# Patient Record
Sex: Female | Born: 1965 | Race: White | Hispanic: No | Marital: Married | Smoking: Never smoker
Health system: Southern US, Community
[De-identification: ages and names within clinical notes are randomized; demographics above are authoritative.]

## PROBLEM LIST (undated history)

## (undated) DIAGNOSIS — F32A Depression, unspecified: Secondary | ICD-10-CM

## (undated) DIAGNOSIS — K219 Gastro-esophageal reflux disease without esophagitis: Secondary | ICD-10-CM

## (undated) DIAGNOSIS — C4490 Unspecified malignant neoplasm of skin, unspecified: Secondary | ICD-10-CM

## (undated) DIAGNOSIS — F1210 Cannabis abuse, uncomplicated: Secondary | ICD-10-CM

## (undated) DIAGNOSIS — F419 Anxiety disorder, unspecified: Secondary | ICD-10-CM

## (undated) DIAGNOSIS — J449 Chronic obstructive pulmonary disease, unspecified: Secondary | ICD-10-CM

## (undated) DIAGNOSIS — F329 Major depressive disorder, single episode, unspecified: Secondary | ICD-10-CM

## (undated) DIAGNOSIS — B1910 Unspecified viral hepatitis B without hepatic coma: Secondary | ICD-10-CM

## (undated) DIAGNOSIS — S52501A Unspecified fracture of the lower end of right radius, initial encounter for closed fracture: Secondary | ICD-10-CM

## (undated) HISTORY — PX: COLONOSCOPY WITH PROPOFOL: SHX5780

---

## 2013-08-06 ENCOUNTER — Ambulatory Visit: Payer: Self-pay | Admitting: Gastroenterology

## 2013-08-09 LAB — PATHOLOGY REPORT

## 2015-12-04 ENCOUNTER — Other Ambulatory Visit: Payer: Self-pay | Admitting: Obstetrics and Gynecology

## 2015-12-04 DIAGNOSIS — Z1231 Encounter for screening mammogram for malignant neoplasm of breast: Secondary | ICD-10-CM

## 2016-01-05 ENCOUNTER — Emergency Department
Admission: EM | Admit: 2016-01-05 | Discharge: 2016-01-05 | Disposition: A | Discharge status: Home / Self Care | Payer: Self-pay | Attending: Emergency Medicine | Admitting: Emergency Medicine

## 2016-01-05 DIAGNOSIS — Z85828 Personal history of other malignant neoplasm of skin: Secondary | ICD-10-CM | POA: Insufficient documentation

## 2016-01-05 DIAGNOSIS — R197 Diarrhea, unspecified: Secondary | ICD-10-CM | POA: Insufficient documentation

## 2016-01-05 DIAGNOSIS — Z79899 Other long term (current) drug therapy: Secondary | ICD-10-CM | POA: Insufficient documentation

## 2016-01-05 DIAGNOSIS — Z7982 Long term (current) use of aspirin: Secondary | ICD-10-CM | POA: Insufficient documentation

## 2016-01-05 DIAGNOSIS — R112 Nausea with vomiting, unspecified: Secondary | ICD-10-CM | POA: Insufficient documentation

## 2016-01-05 DIAGNOSIS — R109 Unspecified abdominal pain: Secondary | ICD-10-CM | POA: Insufficient documentation

## 2016-01-05 HISTORY — DX: Unspecified malignant neoplasm of skin, unspecified: C44.90

## 2016-01-05 HISTORY — DX: Unspecified viral hepatitis B without hepatic coma: B19.10

## 2016-01-05 LAB — URINALYSIS, COMPLETE (UACMP) WITH MICROSCOPIC
Bilirubin Urine: NEGATIVE
Glucose, UA: NEGATIVE mg/dL
Ketones, ur: 5 mg/dL — AB
Leukocytes, UA: NEGATIVE
Nitrite: NEGATIVE
PH: 6 (ref 5.0–8.0)
Protein, ur: 30 mg/dL — AB
SPECIFIC GRAVITY, URINE: 1.026 (ref 1.005–1.030)

## 2016-01-05 LAB — COMPREHENSIVE METABOLIC PANEL
ALK PHOS: 57 U/L (ref 38–126)
ALT: 20 U/L (ref 14–54)
AST: 21 U/L (ref 15–41)
Albumin: 4 g/dL (ref 3.5–5.0)
Anion gap: 10 (ref 5–15)
BUN: 13 mg/dL (ref 6–20)
CALCIUM: 9 mg/dL (ref 8.9–10.3)
CO2: 23 mmol/L (ref 22–32)
CREATININE: 0.75 mg/dL (ref 0.44–1.00)
Chloride: 104 mmol/L (ref 101–111)
GFR calc non Af Amer: >60 mL/min (ref >60)
Glucose, Bld: 112 mg/dL — ABNORMAL HIGH (ref 65–99)
Potassium: 3 mmol/L — ABNORMAL LOW (ref 3.5–5.1)
SODIUM: 137 mmol/L (ref 135–145)
Total Bilirubin: 1 mg/dL (ref 0.3–1.2)
Total Protein: 7 g/dL (ref 6.5–8.1)

## 2016-01-05 LAB — CBC
HCT: 42.1 % (ref 35.0–47.0)
Hemoglobin: 14.9 g/dL (ref 12.0–16.0)
MCH: 32.4 pg (ref 26.0–34.0)
MCHC: 35.3 g/dL (ref 32.0–36.0)
MCV: 91.7 fL (ref 80.0–100.0)
PLATELETS: 221 10*3/uL (ref 150–440)
RBC: 4.6 MIL/uL (ref 3.80–5.20)
RDW: 13.5 % (ref 11.5–14.5)
WBC: 7.3 10*3/uL (ref 3.6–11.0)

## 2016-01-05 LAB — LIPASE, BLOOD: Lipase: 19 U/L (ref 11–51)

## 2016-01-05 MED ORDER — SODIUM CHLORIDE 0.9 % IV BOLUS (SEPSIS)
1000.0000 mL | Freq: Once | INTRAVENOUS | Status: AC
  Administered 2016-01-05: 1000 mL via INTRAVENOUS

## 2016-01-05 MED ORDER — PROMETHAZINE HCL 25 MG/ML IJ SOLN
6.2500 mg | Freq: Once | INTRAMUSCULAR | Status: AC
  Administered 2016-01-05: 6.25 mg via INTRAVENOUS
  Filled 2016-01-05: qty 1

## 2016-01-05 MED ORDER — POTASSIUM CHLORIDE CRYS ER 20 MEQ PO TBCR
20.0000 meq | EXTENDED_RELEASE_TABLET | Freq: Once | ORAL | Status: AC
  Administered 2016-01-05: 20 meq via ORAL
  Filled 2016-01-05: qty 1

## 2016-01-05 MED ORDER — PROMETHAZINE HCL 12.5 MG RE SUPP: 12.5000 mg | Freq: Four times a day (QID) | RECTAL | 0 refills | Status: DC | PRN

## 2016-01-05 MED ORDER — PROMETHAZINE HCL 12.5 MG PO TABS: 12.5000 mg | ORAL_TABLET | Freq: Four times a day (QID) | ORAL | 0 refills | Status: DC | PRN

## 2016-01-05 NOTE — ED Notes (Signed)
Dr Marcelene Butte notified of bradycardia

## 2016-01-05 NOTE — Discharge Instructions (Signed)
Please return especially for fever, focal abdominal pain, bloody diarrhea, bloody emesis, or any other new concerns. Status tolerated drink plenty of fluids such as Gatorade or other sports drinks.  Please return immediately if condition worsens. Please contact her primary physician or the physician you were given for referral. If you have any specialist physicians involved in her treatment and plan please also contact them. Thank you for using [facility].

## 2016-01-05 NOTE — ED Triage Notes (Signed)
Pt came to ED via pov c/o n/v/d since Monday. Reports cannot keep anything down. VS stable.

## 2016-01-05 NOTE — ED Provider Notes (Signed)
Time Seen: Approximately 1107  I have reviewed the triage notes  Chief Complaint: Nausea; Emesis; and Abdominal Pain   History of Present Illness: Erin Briggs is a 50 y.o. female who states a five-day history of nausea, vomiting, and diarrhea. She denies any hematemesis or biliary emesis. She denies any obvious fever at home no blood in her stool. She states she hasn't had any bowel movements today. She still has persistent nausea and vomiting hasn't been able to maintain any consistent food or fluid intake. She denies any focal abdominal pain and has no history of significant abdominal surgery.   Past Medical History:  Diagnosis Date  . Hepatitis B   . Skin cancer     There are no active problems to display for this patient.   Past Surgical History:  Procedure Laterality Date  . CESAREAN SECTION      Past Surgical History:  Procedure Laterality Date  . CESAREAN SECTION        Allergies:  Patient has no allergy information on record.  Family History: No family history on file.  Social History: Social History  Substance Use Topics  . Smoking status: Never Smoker  . Smokeless tobacco: Never Used  . Alcohol use Yes     Comment: ocasionally     Review of Systems:   10 point review of systems was performed and was otherwise negative:  Constitutional: No fever Eyes: No visual disturbances ENT: No sore throat, ear pain Cardiac: No chest pain Respiratory: No shortness of breath, wheezing, or stridor Abdomen: Diffuse crampy abdominal pain Endocrine: No weight loss, No night sweats Extremities: No peripheral edema, cyanosis Skin: No rashes, easy bruising Neurologic: No focal weakness, trouble with speech or swollowing Urologic: No dysuria, Hematuria, or urinary frequency   Physical Exam:  ED Triage Vitals  Enc Vitals Group     BP 01/05/16 0952 108/64     Pulse Rate 01/05/16 0952 (!) 55     Resp 01/05/16 0952 16     Temp 01/05/16 0952 98.1 F (36.7 C)      Temp Source 01/05/16 0952 Oral     SpO2 01/05/16 0952 99 %     Weight 01/05/16 0953 180 lb (81.6 kg)     Height 01/05/16 0953 5\' 9"  (1.753 m)     Head Circumference --      Peak Flow --      Pain Score --      Pain Loc --      Pain Edu? --      Excl. in [place]? --     General: Awake , Alert , and Oriented times 3; GCS 15 Head: Normal cephalic , atraumatic Eyes: Pupils equal , round, reactive to light Nose/Throat: No nasal drainage, patent upper airway without erythema or exudate.  Neck: Supple, Full range of motion, No anterior adenopathy or palpable thyroid masses Lungs: Clear to ascultation without wheezes , rhonchi, or rales Heart: Regular rate, regular rhythm without murmurs , gallops , or rubs Abdomen: Soft, non tender without rebound, guarding , or rigidity; bowel sounds positive and symmetric in all 4 quadrants. No organomegaly .        Extremities: 2 plus symmetric pulses. No edema, clubbing or cyanosis Neurologic: normal ambulation, Motor symmetric without deficits, sensory intact Skin: warm, dry, no rashes   Labs:   All laboratory work was reviewed including any pertinent negatives or positives listed below:  Labs Reviewed  COMPREHENSIVE METABOLIC PANEL - Abnormal; Notable for the  following:       Result Value   Potassium 3.0 (*)    Glucose, Bld 112 (*)    All other components within normal limits  URINALYSIS, COMPLETE (UACMP) WITH MICROSCOPIC - Abnormal; Notable for the following:    Color, Urine AMBER (*)    APPearance CLEAR (*)    Hgb urine dipstick MODERATE (*)    Ketones, ur 5 (*)    Protein, ur 30 (*)    Bacteria, UA RARE (*)    Squamous Epithelial / LPF 0-5 (*)    All other components within normal limits  LIPASE, BLOOD  CBC    EKG: * ED ECG REPORT I, Daymon Larsen, the attending physician, personally viewed and interpreted this ECG.  Date: 01/05/2016 EKG Time: 1143 Rate: 43 Rhythm: Sinus bradycardia QRS Axis: normal Intervals: normal ST/T  Wave abnormalities: normal. Early repolarization Conduction Disturbances: none Narrative Interpretation: unremarkable No acute ischemic changes  ED Course: Patient felt symptomatically improved and states the nausea is improved. She was later offered fluids and crackers and seemed to be able to drink and tolerate by mouth intake. She most likely has a viral syndrome though he did have a conversation about her marijuana usage which apparently she had smoked just prior to all of her symptoms starting and this could be cyclic vomiting syndrome. She was advised to advance her diet as tolerated and return especially for abdominal pain, bloody diarrhea, fever, or any other new concerns. Clinical Course      Assessment:  Viral syndrome      Plan: * Outpatient Patient was advised to return immediately if condition worsens. Patient was advised to follow up with their primary care physician or other specialized physicians involved in their outpatient care. The patient and/or family member/power of attorney had laboratory results reviewed at the bedside. All questions and concerns were addressed and appropriate discharge instructions were distributed by the nursing staff.            Daymon Larsen, MD 01/05/16 [id number]

## 2016-01-05 NOTE — ED Notes (Signed)
Offered pt water and after a few sips she became nauseated - she refused crackers and requested to wait 20-30 minuted before attempting to eat/drink anything else

## 2016-01-05 NOTE — ED Notes (Signed)
Pt reports that she has nausea and vomiting since Monday ( no loose stools today/vomited 3 times today) - denies blood in stool or emesis - afebrile - MD at bedside assessing pt

## 2016-12-04 ENCOUNTER — Encounter: Payer: Self-pay | Admitting: Emergency Medicine

## 2016-12-04 ENCOUNTER — Emergency Department: Payer: No Typology Code available for payment source

## 2016-12-04 ENCOUNTER — Emergency Department
Admission: EM | Admit: 2016-12-04 | Discharge: 2016-12-04 | Disposition: A | Discharge status: Home / Self Care | Payer: No Typology Code available for payment source | Attending: Emergency Medicine | Admitting: Emergency Medicine

## 2016-12-04 DIAGNOSIS — Z79899 Other long term (current) drug therapy: Secondary | ICD-10-CM | POA: Insufficient documentation

## 2016-12-04 DIAGNOSIS — Y999 Unspecified external cause status: Secondary | ICD-10-CM | POA: Diagnosis not present

## 2016-12-04 DIAGNOSIS — Y9389 Activity, other specified: Secondary | ICD-10-CM | POA: Diagnosis not present

## 2016-12-04 DIAGNOSIS — Y92410 Unspecified street and highway as the place of occurrence of the external cause: Secondary | ICD-10-CM | POA: Diagnosis not present

## 2016-12-04 DIAGNOSIS — S40021A Contusion of right upper arm, initial encounter: Secondary | ICD-10-CM

## 2016-12-04 DIAGNOSIS — Z85828 Personal history of other malignant neoplasm of skin: Secondary | ICD-10-CM | POA: Insufficient documentation

## 2016-12-04 DIAGNOSIS — S6991XA Unspecified injury of right wrist, hand and finger(s), initial encounter: Secondary | ICD-10-CM | POA: Diagnosis present

## 2016-12-04 DIAGNOSIS — S52501A Unspecified fracture of the lower end of right radius, initial encounter for closed fracture: Secondary | ICD-10-CM | POA: Insufficient documentation

## 2016-12-04 MED ORDER — FENTANYL CITRATE (PF) 100 MCG/2ML IJ SOLN
50.0000 ug | Freq: Once | INTRAMUSCULAR | Status: AC
  Administered 2016-12-04: 50 ug via INTRAVENOUS

## 2016-12-04 MED ORDER — MIDAZOLAM HCL 5 MG/5ML IJ SOLN
INTRAMUSCULAR | Status: AC
  Administered 2016-12-04: 4 mg via INTRAVENOUS
  Filled 2016-12-04: qty 5

## 2016-12-04 MED ORDER — MIDAZOLAM HCL 5 MG/5ML IJ SOLN
4.0000 mg | Freq: Once | INTRAMUSCULAR | Status: AC
  Administered 2016-12-04: 4 mg via INTRAVENOUS

## 2016-12-04 MED ORDER — LIDOCAINE HCL (PF) 1 % IJ SOLN
20.0000 mL | Freq: Once | INTRAMUSCULAR | Status: AC
  Administered 2016-12-04: 10 mL

## 2016-12-04 MED ORDER — OXYCODONE-ACETAMINOPHEN 5-325 MG PO TABS: 1.0000 | ORAL_TABLET | ORAL | 0 refills | Status: DC | PRN

## 2016-12-04 MED ORDER — FENTANYL CITRATE (PF) 100 MCG/2ML IJ SOLN
INTRAMUSCULAR | Status: AC
  Administered 2016-12-04: 50 ug via INTRAVENOUS
  Filled 2016-12-04: qty 2

## 2016-12-04 MED ORDER — LIDOCAINE HCL (PF) 1 % IJ SOLN
INTRAMUSCULAR | Status: AC
  Administered 2016-12-04: 10 mL
  Filled 2016-12-04: qty 20

## 2016-12-04 MED ORDER — FENTANYL CITRATE (PF) 100 MCG/2ML IJ SOLN
INTRAMUSCULAR | Status: AC
  Filled 2016-12-04: qty 2

## 2016-12-04 MED ORDER — MORPHINE SULFATE (PF) 4 MG/ML IV SOLN
4.0000 mg | Freq: Once | INTRAVENOUS | Status: AC
  Administered 2016-12-04: 4 mg via INTRAMUSCULAR
  Filled 2016-12-04: qty 1

## 2016-12-04 MED ORDER — FENTANYL CITRATE (PF) 100 MCG/2ML IJ SOLN
100.0000 ug | Freq: Once | INTRAMUSCULAR | Status: AC
  Administered 2016-12-04: 100 ug via INTRAVENOUS

## 2016-12-04 MED ORDER — MORPHINE SULFATE (PF) 4 MG/ML IV SOLN
4.0000 mg | Freq: Once | INTRAVENOUS | Status: AC
  Administered 2016-12-04: 4 mg via INTRAVENOUS
  Filled 2016-12-04: qty 1

## 2016-12-04 NOTE — ED Notes (Signed)
Pt given sandwich tray and sprite.  

## 2016-12-04 NOTE — ED Notes (Signed)
Patient placed in gown
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
## 2016-12-04 NOTE — ED Notes (Signed)
Pt called out requesting pain medication. MD informed
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
## 2016-12-04 NOTE — ED Notes (Signed)
Ortho MD at bedside.
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
## 2016-12-04 NOTE — ED Notes (Signed)

## 2016-12-04 NOTE — ED Notes (Signed)
Called [facility] for transfer  [phone]

## 2016-12-04 NOTE — Discharge Instructions (Signed)
With the arm elevated when possible.  Keep the splint completely dry and do not attempt to take it off.  If you do not receive a follow-up call from Dr. Theodore Demark office tomorrow you can call to arrange follow-up within the next week.  Take the pain medication as needed.  Return to the ER for new or worsening pain, worsening numbness or weakness, or any other new or worsening symptoms that concern you.

## 2016-12-06 ENCOUNTER — Encounter (HOSPITAL_BASED_OUTPATIENT_CLINIC_OR_DEPARTMENT_OTHER): Payer: Self-pay | Admitting: *Deleted

## 2016-12-06 ENCOUNTER — Other Ambulatory Visit: Payer: Self-pay

## 2016-12-06 ENCOUNTER — Other Ambulatory Visit: Payer: Self-pay | Admitting: Orthopedic Surgery
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
## 2016-12-09 NOTE — Discharge Instructions (Addendum)
Discharge Instructions   You have a dressing with a plaster splint incorporated in it. Move your fingers as much as possible, making a full fist and fully opening the fist. Elevate your hand to reduce pain & swelling of the digits.  Ice over the operative site may be helpful to reduce pain & swelling.  DO NOT USE HEAT. Leave the dressing in place until you return to our office.  You may shower, but keep the bandage clean & dry.  You may drive a car when you are off of prescription pain medications and can safely control your vehicle with both hands. Our office will call you to arrange follow-up   Please call [phone] during normal business hours or [phone] after hours for any problems. Including the following:   Regional Anesthesia Blocks  1. Numbness or the inability to move the "blocked" extremity may last from 3-48 hours after placement. The length of time depends on the medication injected and your individual response to the medication. If the numbness is not going away after 48 hours, call your surgeon.  2. The extremity that is blocked will need to be protected until the numbness is gone and the  Strength has returned. Because you cannot feel it, you will need to take extra care to avoid injury. Because it may be weak, you may have difficulty moving it or using it. You may not know what position it is in without looking at it while the block is in effect.  3. For blocks in the legs and feet, returning to weight bearing and walking needs to be done carefully. You will need to wait until the numbness is entirely gone and the strength has returned. You should be able to move your leg and foot normally before you try and bear weight or walk. You will need someone to be with you when you first try to ensure you do not fall and possibly risk injury.  4. Bruising and tenderness at the needle site are common side effects and will resolve in a few days.  5. Persistent numbness or new  problems with movement should be communicated to the surgeon or the [place] [phone] [place] [phone]).Regional Anesthesia Blocks  1. Numbness or the inability to move the "blocked" extremity may last from 3-48 hours after placement. The length of time depends on the medication injected and your individual response to the medication. If the numbness is not going away after 48 hours, call your surgeon.  2. The extremity that is blocked will need to be protected until the numbness is gone and the  Strength has returned. Because you cannot feel it, you will need to take extra care to avoid injury. Because it may be weak, you may have difficulty moving it or using it. You may not know what position it is in without looking at it while the block is in effect.  3. For blocks in the legs and feet, returning to weight bearing and walking needs to be done carefully. You will need to wait until the numbness is entirely gone and the strength has returned. You should be able to move your leg and foot normally before you try and bear weight or walk. You will need someone to be with you when you first try to ensure you do not fall and possibly risk injury.  4. Bruising and tenderness at the needle site are common side effects and will resolve in a few days.  5.  Persistent numbness or new problems with movement should be communicated to the surgeon or the [place] [phone] [place] [phone]).     Post Anesthesia Home Care Instructions  Activity: Get plenty of rest for the remainder of the day. A responsible individual must stay with you for 24 hours following the procedure.  For the next 24 hours, DO NOT: -Drive a car -Paediatric nurse -Drink alcoholic beverages -Take any medication unless instructed by your physician -Make any legal decisions or sign important papers.  Meals: Start with liquid foods such as gelatin  or soup. Progress to regular foods as tolerated. Avoid greasy, spicy, heavy foods. If nausea and/or vomiting occur, drink only clear liquids until the nausea and/or vomiting subsides. Call your physician if vomiting continues.  Special Instructions/Symptoms: Your throat may feel dry or sore from the anesthesia or the breathing tube placed in your throat during surgery. If this causes discomfort, gargle with warm salt water. The discomfort should disappear within 24 hours.  If you had a scopolamine patch placed behind your ear for the management of post- operative nausea and/or vomiting:  1. The medication in the patch is effective for 72 hours, after which it should be removed.  Wrap patch in a tissue and discard in the trash. Wash hands thoroughly with soap and water. 2. You may remove the patch earlier than 72 hours if you experience unpleasant side effects which may include dry mouth, dizziness or visual disturbances. 3. Avoid touching the patch. Wash your hands with soap and water after contact with the patch.

## 2016-12-10 ENCOUNTER — Other Ambulatory Visit: Payer: Self-pay

## 2016-12-10 ENCOUNTER — Ambulatory Visit (HOSPITAL_BASED_OUTPATIENT_CLINIC_OR_DEPARTMENT_OTHER): Payer: Self-pay | Admitting: Anesthesiology

## 2016-12-10 ENCOUNTER — Ambulatory Visit (HOSPITAL_BASED_OUTPATIENT_CLINIC_OR_DEPARTMENT_OTHER)
Admission: RE | Admit: 2016-12-10 | Discharge: 2016-12-10 | Disposition: A | Discharge status: Home / Self Care | Payer: Self-pay | Source: Ambulatory Visit | Attending: Orthopedic Surgery | Admitting: Orthopedic Surgery

## 2016-12-10 ENCOUNTER — Encounter (HOSPITAL_BASED_OUTPATIENT_CLINIC_OR_DEPARTMENT_OTHER): Payer: Self-pay | Admitting: *Deleted

## 2016-12-10 ENCOUNTER — Ambulatory Visit (HOSPITAL_COMMUNITY): Payer: Self-pay

## 2016-12-10 ENCOUNTER — Encounter (HOSPITAL_BASED_OUTPATIENT_CLINIC_OR_DEPARTMENT_OTHER)
Admission: RE | Disposition: A | Discharge status: Home / Self Care | Payer: Self-pay | Source: Ambulatory Visit | Attending: Orthopedic Surgery

## 2016-12-10 DIAGNOSIS — S52571A Other intraarticular fracture of lower end of right radius, initial encounter for closed fracture: Secondary | ICD-10-CM | POA: Insufficient documentation

## 2016-12-10 DIAGNOSIS — S52609A Unspecified fracture of lower end of unspecified ulna, initial encounter for closed fracture: Secondary | ICD-10-CM | POA: Insufficient documentation

## 2016-12-10 DIAGNOSIS — Z419 Encounter for procedure for purposes other than remedying health state, unspecified: Secondary | ICD-10-CM

## 2016-12-10 HISTORY — DX: Unspecified fracture of the lower end of right radius, initial encounter for closed fracture: S52.501A

## 2016-12-10 HISTORY — DX: Anxiety disorder, unspecified: F41.9

## 2016-12-10 HISTORY — PX: OPEN REDUCTION INTERNAL FIXATION (ORIF) DISTAL RADIAL FRACTURE: SHX5989

## 2016-12-10 HISTORY — DX: Depression, unspecified: F32.A

## 2016-12-10 HISTORY — DX: Cannabis abuse, uncomplicated: F12.10

## 2016-12-10 HISTORY — DX: Major depressive disorder, single episode, unspecified: F32.9

## 2016-12-10 SURGERY — OPEN REDUCTION INTERNAL FIXATION (ORIF) DISTAL RADIUS FRACTURE
Anesthesia: Regional | Site: Wrist | Laterality: Right

## 2016-12-10 MED ORDER — 0.9 % SODIUM CHLORIDE (POUR BTL) OPTIME
TOPICAL | Status: DC | PRN
  Administered 2016-12-10: 200 mL

## 2016-12-10 MED ORDER — FENTANYL CITRATE (PF) 100 MCG/2ML IJ SOLN
50.0000 ug | INTRAMUSCULAR | Status: DC | PRN
  Administered 2016-12-10: 100 ug via INTRAVENOUS

## 2016-12-10 MED ORDER — ACETAMINOPHEN 325 MG PO TABS: 650.0000 mg | ORAL_TABLET | Freq: Four times a day (QID) | ORAL | Status: AC

## 2016-12-10 MED ORDER — PROPOFOL 10 MG/ML IV BOLUS
INTRAVENOUS | Status: AC
  Filled 2016-12-10: qty 20

## 2016-12-10 MED ORDER — OXYCODONE HCL 5 MG PO TABS: 5.0000 mg | ORAL_TABLET | Freq: Once | ORAL | Status: DC | PRN

## 2016-12-10 MED ORDER — MIDAZOLAM HCL 2 MG/2ML IJ SOLN
INTRAMUSCULAR | Status: AC
  Filled 2016-12-10: qty 2

## 2016-12-10 MED ORDER — FENTANYL CITRATE (PF) 100 MCG/2ML IJ SOLN: 25.0000 ug | INTRAMUSCULAR | Status: DC | PRN

## 2016-12-10 MED ORDER — SCOPOLAMINE 1 MG/3DAYS TD PT72: 1.0000 | MEDICATED_PATCH | Freq: Once | TRANSDERMAL | Status: DC | PRN

## 2016-12-10 MED ORDER — MIDAZOLAM HCL 2 MG/2ML IJ SOLN
1.0000 mg | INTRAMUSCULAR | Status: DC | PRN
  Administered 2016-12-10: 2 mg via INTRAVENOUS

## 2016-12-10 MED ORDER — CEFAZOLIN SODIUM-DEXTROSE 2-4 GM/100ML-% IV SOLN
2.0000 g | INTRAVENOUS | Status: AC
  Administered 2016-12-10: 2 g via INTRAVENOUS

## 2016-12-10 MED ORDER — CEFAZOLIN SODIUM-DEXTROSE 2-4 GM/100ML-% IV SOLN
INTRAVENOUS | Status: AC
  Filled 2016-12-10: qty 100

## 2016-12-10 MED ORDER — FENTANYL CITRATE (PF) 100 MCG/2ML IJ SOLN
INTRAMUSCULAR | Status: AC
  Filled 2016-12-10: qty 2

## 2016-12-10 MED ORDER — PROPOFOL 500 MG/50ML IV EMUL
INTRAVENOUS | Status: DC | PRN
  Administered 2016-12-10: 70 ug/kg/min via INTRAVENOUS

## 2016-12-10 MED ORDER — IBUPROFEN 200 MG PO TABS: 600.0000 mg | ORAL_TABLET | Freq: Four times a day (QID) | ORAL | Status: AC

## 2016-12-10 MED ORDER — MEPERIDINE HCL 25 MG/ML IJ SOLN: 6.2500 mg | INTRAMUSCULAR | Status: DC | PRN

## 2016-12-10 MED ORDER — DEXAMETHASONE SODIUM PHOSPHATE 4 MG/ML IJ SOLN
INTRAMUSCULAR | Status: DC | PRN
  Administered 2016-12-10: 10 mg via PERINEURAL

## 2016-12-10 MED ORDER — PROMETHAZINE HCL 25 MG/ML IJ SOLN: 6.2500 mg | INTRAMUSCULAR | Status: DC | PRN

## 2016-12-10 MED ORDER — ROPIVACAINE HCL 7.5 MG/ML IJ SOLN
INTRAMUSCULAR | Status: DC | PRN
  Administered 2016-12-10: 40 mL via PERINEURAL

## 2016-12-10 MED ORDER — ONDANSETRON HCL 4 MG/2ML IJ SOLN
INTRAMUSCULAR | Status: DC | PRN
  Administered 2016-12-10: 4 mg via INTRAVENOUS

## 2016-12-10 MED ORDER — OXYCODONE HCL 5 MG/5ML PO SOLN: 5.0000 mg | Freq: Once | ORAL | Status: DC | PRN

## 2016-12-10 MED ORDER — LACTATED RINGERS IV SOLN
INTRAVENOUS | Status: DC
  Administered 2016-12-10 (×2): via INTRAVENOUS

## 2016-12-10 MED ORDER — LACTATED RINGERS IV SOLN
INTRAVENOUS | Status: DC
  Administered 2016-12-10: 12:00:00 via INTRAVENOUS

## 2016-12-10 SURGICAL SUPPLY — 78 items
2.5 Non Locking Screw- Screw Peg 2.5 mm x 13mm ×2 IMPLANT
BANDAGE COBAN STERILE 2 (GAUZE/BANDAGES/DRESSINGS) IMPLANT
BIT DRILL 2 MINI QC DISP (BIT) ×2 IMPLANT
BIT DRILL SOLID 2.0X40MM (BIT) IMPLANT
BIT DRILL SOLID 2.5X40MM (BIT) IMPLANT
BLADE MINI RND TIP GREEN BEAV (BLADE) IMPLANT
BLADE SURG 15 STRL LF DISP TIS (BLADE) ×1 IMPLANT
BLADE SURG 15 STRL SS (BLADE) ×1
BNDG COHESIVE 4X5 TAN STRL (GAUZE/BANDAGES/DRESSINGS) ×2 IMPLANT
BNDG ESMARK 4X9 LF (GAUZE/BANDAGES/DRESSINGS) ×2 IMPLANT
BNDG GAUZE ELAST 4 BULKY (GAUZE/BANDAGES/DRESSINGS) ×2 IMPLANT
BRUSH SCRUB EZ PLAIN DRY (MISCELLANEOUS) ×2 IMPLANT
CANISTER SUCT 1200ML W/VALVE (MISCELLANEOUS) ×2 IMPLANT
CHLORAPREP W/TINT 26ML (MISCELLANEOUS) ×2 IMPLANT
CORD BIPOLAR FORCEPS 12FT (ELECTRODE) ×2 IMPLANT
COVER BACK TABLE 60X90IN (DRAPES) ×2 IMPLANT
COVER MAYO STAND STRL (DRAPES) ×2 IMPLANT
CUFF TOURNIQUET SINGLE 18IN (TOURNIQUET CUFF) ×2 IMPLANT
CUFF TOURNIQUET SINGLE 24IN (TOURNIQUET CUFF) IMPLANT
DRAPE C-ARM 42X72 X-RAY (DRAPES) ×2 IMPLANT
DRAPE EXTREMITY T 121X128X90 (DRAPE) ×2 IMPLANT
DRAPE SURG 17X23 STRL (DRAPES) ×2 IMPLANT
DRILL SOLID 2.0X40MM (BIT)
DRILL SOLID 2.5X40MM (BIT)
DRIVER, AO CONNECTION, SQUARE TIP 2.0 MM ×4 IMPLANT
DRIVER, AP CONNECTION, POLYAXIAL LOCKING SCREW ×2 IMPLANT
DRIVER, UNIVERSAL QUICK CONNECT, T10 ×2 IMPLANT
DRSG ADAPTIC 3X8 NADH LF (GAUZE/BANDAGES/DRESSINGS) ×2 IMPLANT
DRSG EMULSION OIL 3X3 NADH (GAUZE/BANDAGES/DRESSINGS) IMPLANT
ELECT REM PT RETURN 9FT ADLT (ELECTROSURGICAL) ×2
ELECTRODE REM PT RTRN 9FT ADLT (ELECTROSURGICAL) ×1 IMPLANT
GAUZE SPONGE 4X4 12PLY STRL LF (GAUZE/BANDAGES/DRESSINGS) ×2 IMPLANT
GLOVE BIO SURGEON STRL SZ7.5 (GLOVE) ×2 IMPLANT
GLOVE BIOGEL PI IND STRL 7.0 (GLOVE) ×2 IMPLANT
GLOVE BIOGEL PI IND STRL 8 (GLOVE) ×1 IMPLANT
GLOVE BIOGEL PI INDICATOR 7.0 (GLOVE) ×2
GLOVE BIOGEL PI INDICATOR 8 (GLOVE) ×1
GLOVE ECLIPSE 6.5 STRL STRAW (GLOVE) ×4 IMPLANT
GOWN STRL REUS W/ TWL LRG LVL3 (GOWN DISPOSABLE) ×2 IMPLANT
GOWN STRL REUS W/TWL LRG LVL3 (GOWN DISPOSABLE) ×2
GOWN STRL REUS W/TWL XL LVL3 (GOWN DISPOSABLE) ×2 IMPLANT
GUIDE AIMING 1.5MM (WIRE) ×4 IMPLANT
NEEDLE HYPO 25X1 1.5 SAFETY (NEEDLE) IMPLANT
NS IRRIG 1000ML POUR BTL (IV SOLUTION) ×2 IMPLANT
PACK BASIN DAY SURGERY FS (CUSTOM PROCEDURE TRAY) ×2 IMPLANT
PADDING CAST ABS 4INX4YD NS (CAST SUPPLIES) ×1
PADDING CAST ABS COTTON 4X4 ST (CAST SUPPLIES) ×1 IMPLANT
PENCIL BUTTON HOLSTER BLD 10FT (ELECTRODE) ×2 IMPLANT
PLATE LOCKING 2.5 STRAIGHT (Plate) ×2 IMPLANT
RUBBERBAND STERILE (MISCELLANEOUS) IMPLANT
SCREW GEMINUS PALS 2.5X14 (Screw) ×2 IMPLANT
SCREW PEG 2.5X14 NONLOCK (Screw) ×6 IMPLANT
SCREW PEG 2.5X16 NONLOCK (Screw) ×2 IMPLANT
SCREW PEG LOCK 2.5X14 (Peg) ×4 IMPLANT
SCREW PEG LOCK 2.5X18 (Peg) ×2 IMPLANT
SCREWDRIVER BIT 2.0/2.5 127MM (BIT) ×4 IMPLANT
SKELETAL DYNAMICS DVR SET (Set) ×2 IMPLANT
SLEEVE SCD COMPRESS KNEE MED (MISCELLANEOUS) ×2 IMPLANT
SLING ARM FOAM STRAP LRG (SOFTGOODS) ×2 IMPLANT
SPLINT PLASTER CAST XFAST 3X15 (CAST SUPPLIES) ×10 IMPLANT
SPLINT PLASTER XTRA FASTSET 3X (CAST SUPPLIES) ×10
STOCKINETTE 6  STRL (DRAPES) ×1
STOCKINETTE 6 STRL (DRAPES) ×1 IMPLANT
SUCTION FRAZIER HANDLE 10FR (MISCELLANEOUS) ×1
SUCTION TUBE FRAZIER 10FR DISP (MISCELLANEOUS) ×1 IMPLANT
SUT VIC AB 2-0 PS2 27 (SUTURE) ×4 IMPLANT
SUT VICRYL 4-0 PS2 18IN ABS (SUTURE) IMPLANT
SUT VICRYL RAPIDE 4-0 (SUTURE) IMPLANT
SUT VICRYL RAPIDE 4/0 PS 2 (SUTURE) ×4 IMPLANT
SYR 10ML LL (SYRINGE) IMPLANT
SYR BULB 3OZ (MISCELLANEOUS) ×2 IMPLANT
TOWEL OR 17X24 6PK STRL BLUE (TOWEL DISPOSABLE) ×2 IMPLANT
TOWEL OR NON WOVEN STRL DISP B (DISPOSABLE) ×2 IMPLANT
TUBE CONNECTING 20X1/4 (TUBING) ×2 IMPLANT
UNDERPAD 30X30 (UNDERPADS AND DIAPERS) ×2 IMPLANT
WASHER 2.5 THREADED (Orthopedic Implant) ×2 IMPLANT
WIRE FIX 1.5 STANDARD TIP (WIRE)
WIRE FIX 1.5 STD TIP (WIRE) IMPLANT

## 2016-12-10 NOTE — Anesthesia Procedure Notes (Signed)
Anesthesia Regional Block: Axillary brachial plexus block   Pre-Anesthetic Checklist: ,, timeout performed, Correct Patient, Correct Site, Correct Laterality, Correct Procedure, Correct Position, site marked, Risks and benefits discussed,  Surgical consent,  Pre-op evaluation,  At surgeon's request and post-op pain management  Laterality: Right  Prep: chloraprep       Needles:  Injection technique: Single-shot  Needle Type: Stimulator Needle - 40     Needle Length: 4cm  Needle Gauge: 22     Additional Needles:   Procedures:,,,, ultrasound used (permanent image in chart),,,,  Narrative:  Start time: 12/10/2016 12:40 PM End time: 12/10/2016 12:44 PM Injection made incrementally with aspirations every 5 mL. Anesthesiologist: Nolon Nations, MD  Additional Notes: BP cuff, EKG monitors applied. Sedation begun. Nerve location verified with U/S. Anesthetic injected incrementally, slowly , and after neg aspirations under direct u/s guidance. Good perineural spread. Tolerated well.

## 2016-12-10 NOTE — Anesthesia Preprocedure Evaluation (Signed)
Anesthesia Evaluation  Patient identified by MRN, date of birth, ID band Patient awake    Reviewed: Allergy & Precautions, NPO status , Patient's Chart, lab work & pertinent test results  Airway Mallampati: II  TM Distance: >3 FB Neck ROM: Full    Dental no notable dental hx.    Pulmonary neg pulmonary ROS,    Pulmonary exam normal breath sounds clear to auscultation       Cardiovascular negative cardio ROS Normal cardiovascular exam Rhythm:Regular Rate:Normal     Neuro/Psych negative neurological ROS  negative psych ROS   GI/Hepatic negative GI ROS, (+) Hepatitis -  Endo/Other  negative endocrine ROS  Renal/GU negative Renal ROS  negative genitourinary   Musculoskeletal negative musculoskeletal ROS (+)   Abdominal   Peds negative pediatric ROS (+)  Hematology negative hematology ROS (+)   Anesthesia Other Findings   Reproductive/Obstetrics negative OB ROS                             Anesthesia Physical Anesthesia Plan  ASA: II  Anesthesia Plan: Regional   Post-op Pain Management:    Induction:   PONV Risk Score and Plan: 2 and Ondansetron, Dexamethasone and Propofol infusion  Airway Management Planned:   Additional Equipment:   Intra-op Plan:   Post-operative Plan: Extubation in OR  Informed Consent: I have reviewed the patients History and Physical, chart, labs and discussed the procedure including the risks, benefits and alternatives for the proposed anesthesia with the patient or authorized representative who has indicated his/her understanding and acceptance.   Dental advisory given  Plan Discussed with: CRNA  Anesthesia Plan Comments:         Anesthesia Quick Evaluation

## 2016-12-10 NOTE — Progress Notes (Signed)
Assisted Dr. Germeroth with right, ultrasound guided, axillary block. Side rails up, monitors on throughout procedure. See vital signs in flow sheet. Tolerated Procedure well. 

## 2016-12-10 NOTE — Transfer of Care (Signed)
Immediate Anesthesia Transfer of Care Note  Patient: Erin Briggs  Procedure(s) Performed: OPEN TREATMENT OF RIGHT DISTAL RADIUS AND ULNAR FRACTURES (Right Wrist)  Patient Location: PACU  Anesthesia Type:General  Level of Consciousness: awake, alert  and oriented  Airway & Oxygen Therapy: Patient Spontanous Breathing  Post-op Assessment: Report given to RN and Post -op Vital signs reviewed and stable  Post vital signs: Reviewed and stable  Last Vitals:  Vitals:   12/10/16 1514 12/10/16 1515  BP:    Pulse:    Resp: (!) 0 (!) 0  Temp:    SpO2:      Last Pain:  Vitals:   12/10/16 1140  TempSrc: Oral  PainSc: 4          Complications: No apparent anesthesia complications

## 2016-12-10 NOTE — Op Note (Signed)
12/10/2016  1:22 PM  PATIENT:  Erin Briggs  51 y.o. female  PRE-OPERATIVE DIAGNOSIS:   1. Displaced right distal radius fracture      2. Displaced right distal ulna shaft fracture  POST-OPERATIVE DIAGNOSIS:  Same  PROCEDURE:   1.  ORIF R DRFx    2.  ORIF right ulnar shaft fracture  SURGEON: Rayvon Char. Grandville Silos, MD  PHYSICIAN ASSISTANT: Morley Kos, OPA-C  ANESTHESIA:  regional and MAC  SPECIMENS:  None  DRAINS: None  EBL:  less than 50 mL  PREOPERATIVE INDICATIONS:  Erin Briggs is a  51 y.o. female with a displaced right distal radius and ulna fracture  The risks benefits and alternatives were discussed with the patient preoperatively including but not limited to the risks of infection, bleeding, nerve injury, cardiopulmonary complications, the need for revision surgery, among others, and the patient verbalized understanding and consented to proceed.  OPERATIVE IMPLANTS: Skeletal dynamics Geminus plate/screw/pegs & Biomet ALPS hand set 2.58m plate/screws  OPERATIVE PROCEDURE: After receiving prophylactic antibiotics and a regional block, the patient was escorted to the operative theatre and placed in a supine position.   A surgical "time-out" was performed during which the planned procedure, proposed operative site, and the correct patient identity were compared to the operative consent and agreement confirmed by the circulating nurse according to current facility policy. Following application of a tourniquet to the operative extremity, the exposed skin was pre-scrubbed with Hibiclens scrub brush and then was prepped with Chloraprep and draped in the usual sterile fashion. The limb was exsanguinated with an Esmarch bandage and the tourniquet inflated to approximately 1053mg higher than systolic BP.   A sinusoidal-shaped incision was marked and made over the FCR axis and the distal forearm. The skin was incised sharply with scalpel, subcutaneous tissues with blunt and  spreading dissection. The FCR axis was exploited deeply. The pronator quadratus was reflected in an L-shaped ulnarly and the brachioradialis was split in a Z-plasty fashion for later reapproximation. The fracture was inspected and provisionally reduced.  This was confirmed fluoroscopically. The appropriately sized plate was selected and found to fit well. It was placed in its provisional alignment of the radius and this was confirmed fluoroscopically.  It was secured to the radius with a screw through the slotted hole.  Additional adjustments were made as necessary, and the distal holes were all drilled and filled.  Peg/screw length distally was selected on the shorter side of measurements to minimize the risk for dorsal cortical penetration. The remainder of the proximal holes were drilled and filled.   The DRUJ was examined for stability. It was found to be sufficiently stable.   Attention was shifted to the ulna, which remain displaced.  A direct linear longitudinal ulnar approach was made, incising the skin sharply with a scalpel and subcutaneous taste tissues dissected with blunt and spreading dissection down to the deep fascia which was incised on the subcutaneous border of the ulna.  Subperiosteal dissection was carried out both dorsally and volarly around the circumference of the ulna, exposing the fracture in the surface of the bone in the wound.  The fracture was cleaned of debris and then manually reduced with bone clamps.  The reduction was judged to be near-anatomic.  The Biomet 2.5 mm plates from the ALPS handset were selected.  A straight plate was selected, the extra holes removed, leaving 9 holes for application.  4 were applied proximally, 4 distally, and the central hole was at the fracture  site.  This was done with a combination of locking and nonlocking screws.  The alignment was judged to be near-anatomic and final fluoroscopic images were obtained.   Tourniquet was released and additional  hemostasis obtained.  The wounds were then copiously irrigated.  The ulnar wound was closed first, by using 2-0 Vicryl suture to reapproximate the fascial split as well as to provide for deep dermal subcuticular sutures.  4-0 Vicryl Rapide running horizontal mattress suture was placed in the skin.  Attention was then shifted to the closure of the volar wound for the radius and the brachioradialis repaired with 2-0 Vicryl Rapide suture followed by repair of the pronator quadratus with the same suture type.  skin was closed with 2-0 Vicryl deep dermal buried sutures followed by running 4-0 Vicryl Rapide horizontal mattress suture in the skin. A bulky dressing with a volar plaster component was applied and the patient was taken to the recovery room in stable condition.  DISPOSITION: The patient will be discharged home today with typical post-op instructions, returning in 10-15 days for reevaluation with new x-rays of the affected wrist out of the splint to include an inclined lateral and then transition to therapy to have a custom splint constructed and begin rehabilitation.

## 2016-12-10 NOTE — Interval H&P Note (Signed)
History and Physical Interval Note:  12/10/2016 1:17 PM  Erin Briggs  has presented today for surgery, with the diagnosis of RIGHT DISTAL RADIUS FRACTURE S52.571A  The various methods of treatment have been discussed with the patient and family. After consideration of risks, benefits and other options for treatment, the patient has consented to  Procedure(s) with comments: OPEN TREATMENT OF RIGHT DISTAL RADIUS FRACTURE, POSSIBLE PINNING OF DISTAL RADIUS ULNAR JOINT (Right) - GENERAL ANESTHESIA WITH PRE-OP BLOCK as a surgical intervention .  The patient's history has been reviewed, patient examined, no change in status, stable for surgery.  I have reviewed the patient's chart and labs.  Questions were answered to the patient's satisfaction.     [name]

## 2016-12-11 NOTE — Anesthesia Postprocedure Evaluation (Signed)
Anesthesia Post Note  Patient: Erin Briggs  Procedure(s) Performed: OPEN TREATMENT OF RIGHT DISTAL RADIUS AND ULNAR FRACTURES (Right Wrist)     Patient location during evaluation: PACU Anesthesia Type: Regional Level of consciousness: awake and alert Pain management: pain level controlled Vital Signs Assessment: post-procedure vital signs reviewed and stable Respiratory status: spontaneous breathing Cardiovascular status: stable Anesthetic complications: no    Last Vitals:  Vitals:   12/10/16 1545 12/10/16 1605  BP:  123/65  Pulse:  65  Resp:  16  Temp: 36.6 C 36.6 C  SpO2: 97% 100%    Last Pain:  Vitals:   12/10/16 1615  TempSrc:   PainSc: 0-No pain                 Nolon Nations

## 2016-12-12 ENCOUNTER — Encounter (HOSPITAL_BASED_OUTPATIENT_CLINIC_OR_DEPARTMENT_OTHER): Payer: Self-pay | Admitting: Orthopedic Surgery

## 2016-12-12 ENCOUNTER — Ambulatory Visit: Admit: 2016-12-12 | Payer: Self-pay | Admitting: Orthopedic Surgery

## 2016-12-12 SURGERY — OPEN REDUCTION INTERNAL FIXATION (ORIF) ULNAR FRACTURE
Anesthesia: Choice | Laterality: Right

## 2017-03-24 ENCOUNTER — Encounter: Payer: Self-pay | Admitting: Genetic Counselor

## 2017-03-24 ENCOUNTER — Telehealth: Payer: Self-pay | Admitting: Genetic Counselor

## 2017-03-24 NOTE — Telephone Encounter (Signed)
A genetic counseling appt has been scheduled for the pt to see Ofri on 3/21 at 8am along with her sister. Address verified. Letter mailed

## 2017-03-28 ENCOUNTER — Other Ambulatory Visit: Payer: Self-pay

## 2017-04-17 ENCOUNTER — Other Ambulatory Visit: Payer: Self-pay

## 2019-11-08 ENCOUNTER — Other Ambulatory Visit: Payer: Self-pay

## 2019-11-08 DIAGNOSIS — Z20822 Contact with and (suspected) exposure to covid-19: Secondary | ICD-10-CM

## 2019-11-10 LAB — SARS-COV-2, NAA 2 DAY TAT

## 2019-11-10 LAB — NOVEL CORONAVIRUS, NAA: SARS-CoV-2, NAA: NOT DETECTED

## 2020-03-12 ENCOUNTER — Emergency Department
Admission: EM | Admit: 2020-03-12 | Discharge: 2020-03-13 | Disposition: A | Discharge status: Home / Self Care | Payer: Self-pay | Attending: Emergency Medicine | Admitting: Emergency Medicine

## 2020-03-12 ENCOUNTER — Emergency Department: Payer: Self-pay

## 2020-03-12 ENCOUNTER — Encounter: Payer: Self-pay | Admitting: Radiology

## 2020-03-12 ENCOUNTER — Other Ambulatory Visit: Payer: Self-pay

## 2020-03-12 DIAGNOSIS — E860 Dehydration: Secondary | ICD-10-CM | POA: Insufficient documentation

## 2020-03-12 DIAGNOSIS — Z85828 Personal history of other malignant neoplasm of skin: Secondary | ICD-10-CM | POA: Insufficient documentation

## 2020-03-12 DIAGNOSIS — E876 Hypokalemia: Secondary | ICD-10-CM | POA: Insufficient documentation

## 2020-03-12 DIAGNOSIS — R112 Nausea with vomiting, unspecified: Secondary | ICD-10-CM

## 2020-03-12 DIAGNOSIS — N390 Urinary tract infection, site not specified: Secondary | ICD-10-CM | POA: Insufficient documentation

## 2020-03-12 DIAGNOSIS — R197 Diarrhea, unspecified: Secondary | ICD-10-CM

## 2020-03-12 LAB — COMPREHENSIVE METABOLIC PANEL
ALT: 16 U/L (ref 0–44)
AST: 18 U/L (ref 15–41)
Albumin: 4.5 g/dL (ref 3.5–5.0)
Alkaline Phosphatase: 46 U/L (ref 38–126)
Anion gap: 11 (ref 5–15)
BUN: 15 mg/dL (ref 6–20)
CO2: 19 mmol/L — ABNORMAL LOW (ref 22–32)
Calcium: 8.8 mg/dL — ABNORMAL LOW (ref 8.9–10.3)
Chloride: 102 mmol/L (ref 98–111)
Creatinine, Ser: 0.74 mg/dL (ref 0.44–1.00)
GFR, Estimated: >60 mL/min (ref >60)
Glucose, Bld: 116 mg/dL — ABNORMAL HIGH (ref 70–99)
Potassium: 3 mmol/L — ABNORMAL LOW (ref 3.5–5.1)
Sodium: 132 mmol/L — ABNORMAL LOW (ref 135–145)
Total Bilirubin: 0.9 mg/dL (ref 0.3–1.2)
Total Protein: 7.9 g/dL (ref 6.5–8.1)

## 2020-03-12 LAB — CBC
HCT: 42 % (ref 36.0–46.0)
Hemoglobin: 14.9 g/dL (ref 12.0–15.0)
MCH: 31.4 pg (ref 26.0–34.0)
MCHC: 35.5 g/dL (ref 30.0–36.0)
MCV: 88.6 fL (ref 80.0–100.0)
Platelets: 186 10*3/uL (ref 150–400)
RBC: 4.74 MIL/uL (ref 3.87–5.11)
RDW: 11.7 % (ref 11.5–15.5)
WBC: 6.2 10*3/uL (ref 4.0–10.5)
nRBC: 0 % (ref 0.0–0.2)

## 2020-03-12 LAB — URINALYSIS, COMPLETE (UACMP) WITH MICROSCOPIC
Bilirubin Urine: NEGATIVE
Glucose, UA: NEGATIVE mg/dL
Ketones, ur: 20 mg/dL — AB
Nitrite: NEGATIVE
Protein, ur: 100 mg/dL — AB
Specific Gravity, Urine: 1.029 (ref 1.005–1.030)
Squamous Epithelial / LPF: NONE SEEN (ref 0–5)
pH: 6 (ref 5.0–8.0)

## 2020-03-12 MED ORDER — POTASSIUM CHLORIDE 10 MEQ/100ML IV SOLN
10.0000 meq | Freq: Once | INTRAVENOUS | Status: AC
  Administered 2020-03-13: 10 meq via INTRAVENOUS
  Filled 2020-03-12: qty 100

## 2020-03-12 MED ORDER — ONDANSETRON HCL 4 MG/2ML IJ SOLN
4.0000 mg | Freq: Once | INTRAMUSCULAR | Status: AC
  Administered 2020-03-13: 4 mg via INTRAVENOUS
  Filled 2020-03-12: qty 2

## 2020-03-12 MED ORDER — SODIUM CHLORIDE 0.9 % IV SOLN
1.0000 g | Freq: Once | INTRAVENOUS | Status: AC
  Administered 2020-03-13: 1 g via INTRAVENOUS
  Filled 2020-03-12: qty 10

## 2020-03-12 MED ORDER — DEXTROSE-NACL 5-0.45 % IV SOLN: Freq: Once | INTRAVENOUS | Status: AC

## 2020-03-12 NOTE — ED Triage Notes (Signed)
Pt states vomiting and diarrhea for three days. Pt states she feels like she has acid reflux, denies known fever. Pt denies abd pain, states has had a cough.

## 2020-03-12 NOTE — ED Provider Notes (Signed)
[facility] Emergency Department Provider Note   ____________________________________________   Event Date/Time   First MD Initiated Contact with Patient 03/12/20 2313     (approximate)  I have reviewed the triage vital signs and the nursing notes.   HISTORY  Chief Complaint Vomiting and Diarrhea    HPI Erin Briggs is a 55 y.o. female who presents to the ED from home with a chief complaint of nausea/vomiting/diarrhea.  Symptoms x3 days.  Also endorses dysuria and nonproductive cough.  Denies fever, chest pain, shortness of breath, abdominal or flank pain.  Patient is unvaccinated against COVID-19.  Denies Covid exposure.  No prior history of kidney stones.     Past Medical History:  Diagnosis Date  . Anxiety   . Closed fracture of right distal radius   . Depression   . Hepatitis B   . Marijuana abuse    smokes daily  . Skin cancer     There are no problems to display for this patient.   Past Surgical History:  Procedure Laterality Date  . CESAREAN SECTION    . OPEN REDUCTION INTERNAL FIXATION (ORIF) DISTAL RADIAL FRACTURE Right 12/10/2016   Procedure: OPEN TREATMENT OF RIGHT DISTAL RADIUS AND ULNAR FRACTURES;  Surgeon: Milly Jakob, MD;  Location: [place];  Service: Orthopedics;  Laterality: Right;  GENERAL ANESTHESIA WITH PRE-OP BLOCK    Prior to Admission medications   Medication Sig Start Date End Date Taking? Authorizing Provider  cephALEXin (KEFLEX) 500 MG capsule Take 1 capsule (500 mg total) by mouth 3 (three) times daily. 03/13/20  Yes Paulette Blanch, MD  ondansetron (ZOFRAN ODT) 4 MG disintegrating tablet Take 1 tablet (4 mg total) by mouth every 8 (eight) hours as needed for nausea or vomiting. 03/13/20  Yes Paulette Blanch, MD  acetaminophen (TYLENOL) 325 MG tablet Take 2 tablets (650 mg total) every 6 (six) hours by mouth. 12/10/16   Milly Jakob, MD  cholecalciferol (VITAMIN D) 1000 units tablet Take 1,000  Units by mouth daily.    [provider]  citalopram (CELEXA) 20 MG tablet Take 20 mg daily by mouth.    [provider]  clonazePAM (KLONOPIN) 0.5 MG tablet Take 0.5 mg 2 (two) times daily as needed by mouth for anxiety.    [provider]  ibuprofen (ADVIL) 200 MG tablet Take 3 tablets (600 mg total) every 6 (six) hours by mouth. 12/10/16   Milly Jakob, MD  oxyCODONE (ROXICODONE) 5 MG immediate release tablet Take 1 tablet (5 mg total) every 6 (six) hours as needed by mouth for severe pain. 12/10/16   Milly Jakob, MD  SUMAtriptan (IMITREX) 25 MG tablet Take 1 tablet by mouth daily as needed. 11/16/13   [provider]  tranexamic acid (LYSTEDA) 650 MG TABS tablet Take 1,300 mg by mouth every 6 (six) hours as needed.    [provider]  vitamin C (ASCORBIC ACID) 500 MG tablet Take 1,000 mg daily by mouth.    [provider]  Vitamin D, Ergocalciferol, (DRISDOL) 50000 units CAPS capsule Take 50,000 Units every Friday by mouth. In the morning.    [provider]    Allergies Aspirin  No family history on file.  Social History Social History   Tobacco Use  . Smoking status: Never Smoker  . Smokeless tobacco: Never Used  Substance Use Topics  . Alcohol use: Yes    Comment: ocasionally  . Drug use: Yes    Types:  Marijuana    Comment: smokes daily (last time "yesterday")    Review of Systems  Constitutional: No fever/chills Eyes: No visual changes. ENT: No sore throat. Cardiovascular: Denies chest pain. Respiratory: Denies shortness of breath. Gastrointestinal: No abdominal pain.  Positive for nausea, vomiting and diarrhea.  No constipation. Genitourinary: Positive for dysuria. Musculoskeletal: Negative for back pain. Skin: Negative for rash. Neurological: Negative for headaches, focal weakness or numbness.   ____________________________________________   PHYSICAL EXAM:  VITAL SIGNS: ED Triage Vitals   Enc Vitals Group     BP 03/12/20 2151 (!) 147/85     Pulse Rate 03/12/20 2151 62     Resp 03/12/20 2151 20     Temp 03/12/20 2151 98 F (36.7 C)     Temp Source 03/12/20 2151 Oral     SpO2 03/12/20 2151 98 %     Weight 03/12/20 2153 200 lb (90.7 kg)     Height 03/12/20 2153 5\' 9"  (1.753 m)     Head Circumference --      Peak Flow --      Pain Score 03/12/20 2303 0     Pain Loc --      Pain Edu? --      Excl. in [place]? --     Constitutional: Alert and oriented. Well appearing and in mild acute distress. Eyes: Conjunctivae are normal. PERRL. EOMI. Head: Atraumatic. Nose: No congestion/rhinnorhea. Mouth/Throat: Mucous membranes are mildly dry. Neck: No stridor.   Cardiovascular: Normal rate, regular rhythm. Grossly normal heart sounds.  Good peripheral circulation. Respiratory: Normal respiratory effort.  No retractions. Lungs CTAB. Gastrointestinal: Soft and nontender to light and deep palpation. No distention. No abdominal bruits. No CVA tenderness. Musculoskeletal: No lower extremity tenderness nor edema.  No joint effusions. Neurologic:  Normal speech and language. No gross focal neurologic deficits are appreciated. No gait instability. Skin:  Skin is warm, dry and intact. No rash noted. No vesicles. Psychiatric: Mood and affect are normal. Speech and behavior are normal.  ____________________________________________   LABS (all labs ordered are listed, but only abnormal results are displayed)  Labs Reviewed  COMPREHENSIVE METABOLIC PANEL - Abnormal; Notable for the following components:      Result Value   Sodium 132 (*)    Potassium 3.0 (*)    CO2 19 (*)    Glucose, Bld 116 (*)    Calcium 8.8 (*)    All other components within normal limits  URINALYSIS, COMPLETE (UACMP) WITH MICROSCOPIC - Abnormal; Notable for the following components:   Color, Urine YELLOW (*)    APPearance HAZY (*)    Hgb urine dipstick LARGE (*)    Ketones, ur 20 (*)    Protein, ur 100 (*)     Leukocytes,Ua LARGE (*)    Bacteria, UA RARE (*)    All other components within normal limits  URINE CULTURE  CBC   ____________________________________________  EKG  ED ECG REPORT I, Amoree Newlon J, the attending physician, personally viewed and interpreted this ECG.   Date: 03/12/2020  EKG Time: 2152  Rate: 59  Rhythm: normal EKG, normal sinus rhythm  Axis: Normal  Intervals:none  ST&T Change: Nonspecific  ____________________________________________  RADIOLOGY I, Donielle Radziewicz J, personally viewed and evaluated these images (plain radiographs) as part of my medical decision making, as well as reviewing the written report by the radiologist.  ED MD interpretation: No acute cardiopulmonary process  Official radiology report(s): DG Chest 2 View  Result Date: 03/12/2020 CLINICAL DATA:  Cough.  Vomiting  and diarrhea. EXAM: CHEST - 2 VIEW COMPARISON:  None. FINDINGS: Heart size is normal. Mediastinal shadows are normal. There may be bronchial thickening suggesting bronchitis but there is no infiltrate, collapse or effusion. Surgical clips noted in the right axilla. No significant bone finding. IMPRESSION: Possible bronchitis. No consolidation or collapse. Electronically Signed   By: Nelson Chimes M.D.   On: 03/12/2020 23:52    ____________________________________________   PROCEDURES  Procedure(s) performed (including Critical Care):  Procedures   ____________________________________________   INITIAL IMPRESSION / ASSESSMENT AND PLAN / ED COURSE  As part of my medical decision making, I reviewed the following data within the [place] notes reviewed and incorporated, Labs reviewed, EKG interpreted, Old chart reviewed, Radiograph reviewed and Notes from prior ED visits     55 year old female presenting with a 3-day history of nausea/vomiting/diarrhea. Differential diagnosis includes, but is not limited to, ovarian cyst, ovarian torsion, acute  appendicitis, diverticulitis, urinary tract infection/pyelonephritis, endometriosis, bowel obstruction, colitis, renal colic, gastroenteritis, hernia, fibroids, endometriosis, etc.  Laboratory results demonstrate mild hypokalemia, ketonuria, leukocyte positive UTI.  Will initiate IV fluid resuscitation, IV antiemetics, IV antibiotic and reassess.  Clinical Course as of 03/13/20 0305  Mon Mar 13, 2020  0242 IV Kcl completed. Patient feeling significantly better; tolerated PO without emesis. Will discharge home on Keflex and Zofran to use as needed. Strict return precautions given. Patient and spouse verbalize understanding and agree with plan. [JS]    Clinical Course User Index [JS] Paulette Blanch, MD     ____________________________________________   FINAL CLINICAL IMPRESSION(S) / ED DIAGNOSES  Final diagnoses:  Nausea vomiting and diarrhea  Urinary tract infection without hematuria, site unspecified  Dehydration  Hypokalemia     ED Discharge Orders         Ordered    cephALEXin (KEFLEX) 500 MG capsule  3 times daily        03/13/20 0239    ondansetron (ZOFRAN ODT) 4 MG disintegrating tablet  Every 8 hours PRN        03/13/20 0239          *Please note:  Honesty Samadhi Mahurin was evaluated in Emergency Department on 03/13/2020 for the symptoms described in the history of present illness. She was evaluated in the context of the global COVID-19 pandemic, which necessitated consideration that the patient might be at risk for infection with the SARS-CoV-2 virus that causes COVID-19. Institutional protocols and algorithms that pertain to the evaluation of patients at risk for COVID-19 are in a state of rapid change based on information released by regulatory bodies including the CDC and federal and state organizations. These policies and algorithms were followed during the patient's care in the ED.  Some ED evaluations and interventions may be delayed as a result of limited staffing during  and the pandemic.*   Note:  This document was prepared using Dragon voice recognition software and may include unintentional dictation errors.   Paulette Blanch, MD 03/13/20 [phone]

## 2020-03-13 MED ORDER — ONDANSETRON HCL 4 MG/2ML IJ SOLN
4.0000 mg | Freq: Once | INTRAMUSCULAR | Status: AC
  Administered 2020-03-13: 4 mg via INTRAVENOUS
  Filled 2020-03-13: qty 2

## 2020-03-13 MED ORDER — ONDANSETRON 4 MG PO TBDP: 4.0000 mg | ORAL_TABLET | Freq: Three times a day (TID) | ORAL | 0 refills | Status: AC | PRN

## 2020-03-13 MED ORDER — CEPHALEXIN 500 MG PO CAPS: 500.0000 mg | ORAL_CAPSULE | Freq: Three times a day (TID) | ORAL | 0 refills | Status: DC

## 2020-03-13 NOTE — Discharge Instructions (Signed)
1.  Take antibiotic as prescribed (Keflex 500 mg 3 times daily x7 days). 2.  You may take nausea medicine as needed (Zofran #20). 3.  Clear liquids x 12 hours, then bland diet x 3 days, then slowly advance diet as tolerated. 4.  Return to the ER for worsening symptoms, persistent vomiting, difficulty breathing or other concerns.

## 2020-03-13 NOTE — ED Notes (Signed)
Patient assisted to toilet at this time. Patient c/o continued nausea/dry heaving and has requested more antiemetic. New orders placed.

## 2020-03-14 LAB — URINE CULTURE

## 2020-10-04 ENCOUNTER — Other Ambulatory Visit: Payer: Self-pay

## 2020-10-04 ENCOUNTER — Encounter: Payer: Self-pay | Admitting: Emergency Medicine

## 2020-10-04 DIAGNOSIS — Z5321 Procedure and treatment not carried out due to patient leaving prior to being seen by health care provider: Secondary | ICD-10-CM | POA: Insufficient documentation

## 2020-10-04 DIAGNOSIS — R197 Diarrhea, unspecified: Secondary | ICD-10-CM | POA: Insufficient documentation

## 2020-10-04 DIAGNOSIS — R112 Nausea with vomiting, unspecified: Secondary | ICD-10-CM | POA: Insufficient documentation

## 2020-10-04 LAB — CBC
HCT: 41.4 % (ref 36.0–46.0)
Hemoglobin: 15.5 g/dL — ABNORMAL HIGH (ref 12.0–15.0)
MCH: 32.6 pg (ref 26.0–34.0)
MCHC: 37.4 g/dL — ABNORMAL HIGH (ref 30.0–36.0)
MCV: 87.2 fL (ref 80.0–100.0)
Platelets: 283 10*3/uL (ref 150–400)
RBC: 4.75 MIL/uL (ref 3.87–5.11)
RDW: 11.9 % (ref 11.5–15.5)
WBC: 15.6 10*3/uL — ABNORMAL HIGH (ref 4.0–10.5)
nRBC: 0 % (ref 0.0–0.2)

## 2020-10-04 LAB — URINALYSIS, COMPLETE (UACMP) WITH MICROSCOPIC
Glucose, UA: NEGATIVE mg/dL
Ketones, ur: 15 mg/dL — AB
Leukocytes,Ua: NEGATIVE
Nitrite: NEGATIVE
Protein, ur: >300 mg/dL — AB
Specific Gravity, Urine: >1.03 — ABNORMAL HIGH (ref 1.005–1.030)
pH: 5.5 (ref 5.0–8.0)

## 2020-10-04 LAB — COMPREHENSIVE METABOLIC PANEL
ALT: 18 U/L (ref 0–44)
AST: 20 U/L (ref 15–41)
Albumin: 4.6 g/dL (ref 3.5–5.0)
Alkaline Phosphatase: 57 U/L (ref 38–126)
Anion gap: 11 (ref 5–15)
BUN: 22 mg/dL — ABNORMAL HIGH (ref 6–20)
CO2: 21 mmol/L — ABNORMAL LOW (ref 22–32)
Calcium: 9.6 mg/dL (ref 8.9–10.3)
Chloride: 101 mmol/L (ref 98–111)
Creatinine, Ser: 0.7 mg/dL (ref 0.44–1.00)
GFR, Estimated: >60 mL/min (ref >60)
Glucose, Bld: 119 mg/dL — ABNORMAL HIGH (ref 70–99)
Potassium: 3.4 mmol/L — ABNORMAL LOW (ref 3.5–5.1)
Sodium: 133 mmol/L — ABNORMAL LOW (ref 135–145)
Total Bilirubin: 1 mg/dL (ref 0.3–1.2)
Total Protein: 7.8 g/dL (ref 6.5–8.1)

## 2020-10-04 LAB — LIPASE, BLOOD: Lipase: 27 U/L (ref 11–51)

## 2020-10-04 NOTE — ED Triage Notes (Signed)
Pt reports that she has had N/V for the last three days. She states that she has been unable to keep anything down including water. Today she states that her emesis was black. She has also been having diarrhea.

## 2020-10-05 ENCOUNTER — Emergency Department
Admission: EM | Admit: 2020-10-05 | Discharge: 2020-10-05 | Disposition: A | Discharge status: Home / Self Care | Payer: Self-pay | Attending: Student in an Organized Health Care Education/Training Program | Admitting: Student in an Organized Health Care Education/Training Program

## 2020-10-05 ENCOUNTER — Emergency Department
Admission: EM | Admit: 2020-10-05 | Discharge: 2020-10-05 | Disposition: A | Discharge status: Home / Self Care | Payer: Self-pay | Attending: Emergency Medicine | Admitting: Emergency Medicine

## 2020-10-05 ENCOUNTER — Emergency Department: Payer: Self-pay

## 2020-10-05 ENCOUNTER — Other Ambulatory Visit: Payer: Self-pay

## 2020-10-05 DIAGNOSIS — N12 Tubulo-interstitial nephritis, not specified as acute or chronic: Secondary | ICD-10-CM | POA: Insufficient documentation

## 2020-10-05 DIAGNOSIS — Z85828 Personal history of other malignant neoplasm of skin: Secondary | ICD-10-CM | POA: Insufficient documentation

## 2020-10-05 DIAGNOSIS — R112 Nausea with vomiting, unspecified: Secondary | ICD-10-CM

## 2020-10-05 LAB — COMPREHENSIVE METABOLIC PANEL
ALT: 19 U/L (ref 0–44)
AST: 19 U/L (ref 15–41)
Albumin: 4.6 g/dL (ref 3.5–5.0)
Alkaline Phosphatase: 53 U/L (ref 38–126)
Anion gap: 12 (ref 5–15)
BUN: 26 mg/dL — ABNORMAL HIGH (ref 6–20)
CO2: 22 mmol/L (ref 22–32)
Calcium: 9.6 mg/dL (ref 8.9–10.3)
Chloride: 99 mmol/L (ref 98–111)
Creatinine, Ser: 0.76 mg/dL (ref 0.44–1.00)
GFR, Estimated: >60 mL/min (ref >60)
Glucose, Bld: 117 mg/dL — ABNORMAL HIGH (ref 70–99)
Potassium: 3.3 mmol/L — ABNORMAL LOW (ref 3.5–5.1)
Sodium: 133 mmol/L — ABNORMAL LOW (ref 135–145)
Total Bilirubin: 1.1 mg/dL (ref 0.3–1.2)
Total Protein: 7.7 g/dL (ref 6.5–8.1)

## 2020-10-05 LAB — CBC
HCT: 42 % (ref 36.0–46.0)
Hemoglobin: 15.6 g/dL — ABNORMAL HIGH (ref 12.0–15.0)
MCH: 32.3 pg (ref 26.0–34.0)
MCHC: 37.1 g/dL — ABNORMAL HIGH (ref 30.0–36.0)
MCV: 87 fL (ref 80.0–100.0)
Platelets: 287 10*3/uL (ref 150–400)
RBC: 4.83 MIL/uL (ref 3.87–5.11)
RDW: 11.9 % (ref 11.5–15.5)
WBC: 14.2 10*3/uL — ABNORMAL HIGH (ref 4.0–10.5)
nRBC: 0 % (ref 0.0–0.2)

## 2020-10-05 LAB — URINALYSIS, COMPLETE (UACMP) WITH MICROSCOPIC
Glucose, UA: NEGATIVE mg/dL
Ketones, ur: 40 mg/dL — AB
Nitrite: NEGATIVE
Protein, ur: >300 mg/dL — AB
Specific Gravity, Urine: >1.03 — ABNORMAL HIGH (ref 1.005–1.030)
pH: 5.5 (ref 5.0–8.0)

## 2020-10-05 LAB — LIPASE, BLOOD: Lipase: 27 U/L (ref 11–51)

## 2020-10-05 MED ORDER — SODIUM CHLORIDE 0.9 % IV BOLUS
1000.0000 mL | Freq: Once | INTRAVENOUS | Status: AC
  Administered 2020-10-05: 1000 mL via INTRAVENOUS

## 2020-10-05 MED ORDER — CEPHALEXIN 500 MG PO CAPS: 500.0000 mg | ORAL_CAPSULE | Freq: Three times a day (TID) | ORAL | 0 refills | Status: AC

## 2020-10-05 MED ORDER — SODIUM CHLORIDE 0.9 % IV SOLN
1.0000 g | Freq: Once | INTRAVENOUS | Status: AC
  Administered 2020-10-05: 1 g via INTRAVENOUS
  Filled 2020-10-05: qty 10

## 2020-10-05 MED ORDER — METOCLOPRAMIDE HCL 10 MG PO TABS: 10.0000 mg | ORAL_TABLET | Freq: Four times a day (QID) | ORAL | 0 refills | Status: AC | PRN

## 2020-10-05 MED ORDER — IOHEXOL 350 MG/ML SOLN
100.0000 mL | Freq: Once | INTRAVENOUS | Status: AC | PRN
  Administered 2020-10-05: 80 mL via INTRAVENOUS
  Filled 2020-10-05: qty 100

## 2020-10-05 MED ORDER — POTASSIUM CHLORIDE CRYS ER 20 MEQ PO TBCR
40.0000 meq | EXTENDED_RELEASE_TABLET | Freq: Once | ORAL | Status: AC
  Administered 2020-10-05: 40 meq via ORAL
  Filled 2020-10-05: qty 2

## 2020-10-05 MED ORDER — METOCLOPRAMIDE HCL 5 MG/ML IJ SOLN
10.0000 mg | Freq: Once | INTRAMUSCULAR | Status: AC
  Administered 2020-10-05: 10 mg via INTRAVENOUS
  Filled 2020-10-05: qty 2

## 2020-10-05 NOTE — ED Notes (Signed)
No answer when called several times from lobby 

## 2020-10-05 NOTE — ED Provider Notes (Signed)
[facility] Emergency Department Provider Note    Event Date/Time   First MD Initiated Contact with Patient 10/05/20 1314     (approximate)  I have reviewed the triage vital signs and the nursing notes.   HISTORY  Chief Complaint Emesis    HPI Erin Briggs is a 55 y.o. female below listed past medical history presents to the ER for evaluation of 4 days of intractable nausea vomiting as well as back pain.  Denies any dysuria or frequency has not noted any hematuria.  Think she might of gotten foodborne illness.  Physicians not moving her bowels quite like she was.  Denies any diarrhea.  Denies any chest pain or shortness of breath.  Past Medical History:  Diagnosis Date   Anxiety    Closed fracture of right distal radius    Depression    Hepatitis B    Marijuana abuse    smokes daily   Skin cancer    No family history on file. Past Surgical History:  Procedure Laterality Date   CESAREAN SECTION     OPEN REDUCTION INTERNAL FIXATION (ORIF) DISTAL RADIAL FRACTURE Right 12/10/2016   Procedure: OPEN TREATMENT OF RIGHT DISTAL RADIUS AND ULNAR FRACTURES;  Surgeon: Milly Jakob, MD;  Location: [place];  Service: Orthopedics;  Laterality: Right;  GENERAL ANESTHESIA WITH PRE-OP BLOCK   There are no problems to display for this patient.     Prior to Admission medications   Medication Sig Start Date End Date Taking? Authorizing Provider  cephALEXin (KEFLEX) 500 MG capsule Take 1 capsule (500 mg total) by mouth 3 (three) times daily for 7 days. 10/05/20 10/12/20 Yes Merlyn Lot, MD  metoCLOPramide (REGLAN) 10 MG tablet Take 1 tablet (10 mg total) by mouth every 6 (six) hours as needed for nausea. 10/05/20 10/05/21 Yes Merlyn Lot, MD  acetaminophen (TYLENOL) 325 MG tablet Take 2 tablets (650 mg total) every 6 (six) hours by mouth. 12/10/16   Milly Jakob, MD  cholecalciferol (VITAMIN D) 1000 units tablet Take 1,000 Units by  mouth daily.    [provider]  citalopram (CELEXA) 20 MG tablet Take 20 mg daily by mouth.    [provider]  clonazePAM (KLONOPIN) 0.5 MG tablet Take 0.5 mg 2 (two) times daily as needed by mouth for anxiety.    [provider]  ibuprofen (ADVIL) 200 MG tablet Take 3 tablets (600 mg total) every 6 (six) hours by mouth. 12/10/16   Milly Jakob, MD  ondansetron (ZOFRAN ODT) 4 MG disintegrating tablet Take 1 tablet (4 mg total) by mouth every 8 (eight) hours as needed for nausea or vomiting. 03/13/20   Paulette Blanch, MD  oxyCODONE (ROXICODONE) 5 MG immediate release tablet Take 1 tablet (5 mg total) every 6 (six) hours as needed by mouth for severe pain. 12/10/16   Milly Jakob, MD  SUMAtriptan (IMITREX) 25 MG tablet Take 1 tablet by mouth daily as needed. 11/16/13   [provider]  tranexamic acid (LYSTEDA) 650 MG TABS tablet Take 1,300 mg by mouth every 6 (six) hours as needed.    [provider]  vitamin C (ASCORBIC ACID) 500 MG tablet Take 1,000 mg daily by mouth.    [provider]  Vitamin D, Ergocalciferol, (DRISDOL) 50000 units CAPS capsule Take 50,000 Units every Friday by mouth. In the morning.    [provider]    Allergies Aspirin    Social History Social History   Tobacco  Use   Smoking status: Never   Smokeless tobacco: Never  Substance Use Topics   Alcohol use: Yes    Comment: occasionally   Drug use: Yes    Types: Marijuana    Comment: smokes daily (last time "x1 week ago")    Review of Systems Patient denies headaches, rhinorrhea, blurry vision, numbness, shortness of breath, chest pain, edema, cough, abdominal pain, nausea, vomiting, diarrhea, dysuria, fevers, rashes or hallucinations unless otherwise stated above in HPI. ____________________________________________   PHYSICAL EXAM:  VITAL SIGNS: Vitals:   10/05/20 1432 10/05/20 1605  BP: 138/63 (!) 154/78  Pulse: 65 66  Resp: 18 18   Temp:    SpO2: 98% 98%    Constitutional: Alert and oriented.  Eyes: Conjunctivae are normal.  Head: Atraumatic. Nose: No congestion/rhinnorhea. Mouth/Throat: Mucous membranes are dry.   Neck: No stridor. Painless ROM.  Cardiovascular: Normal rate, regular rhythm. Grossly normal heart sounds.  Good peripheral circulation. Respiratory: Normal respiratory effort.  No retractions. Lungs CTAB. Gastrointestinal: Soft and nontender. No distention. No abdominal bruits. No CVA tenderness. Genitourinary:  Musculoskeletal: No lower extremity tenderness nor edema.  No joint effusions. Neurologic:  Normal speech and language. No gross focal neurologic deficits are appreciated. No facial droop Skin:  Skin is warm, dry and intact. No rash noted. Psychiatric: Mood and affect are normal. Speech and behavior are normal.  ____________________________________________   LABS (all labs ordered are listed, but only abnormal results are displayed)  Results for orders placed or performed during the hospital encounter of 10/05/20 (from the past 24 hour(s))  Lipase, blood     Status: None   Collection Time: 10/05/20 11:51 AM  Result Value Ref Range   Lipase 27 11 - 51 U/L  Comprehensive metabolic panel     Status: Abnormal   Collection Time: 10/05/20 11:51 AM  Result Value Ref Range   Sodium 133 (L) 135 - 145 mmol/L   Potassium 3.3 (L) 3.5 - 5.1 mmol/L   Chloride 99 98 - 111 mmol/L   CO2 22 22 - 32 mmol/L   Glucose, Bld 117 (H) 70 - 99 mg/dL   BUN 26 (H) 6 - 20 mg/dL   Creatinine, Ser 0.76 0.44 - 1.00 mg/dL   Calcium 9.6 8.9 - 10.3 mg/dL   Total Protein 7.7 6.5 - 8.1 g/dL   Albumin 4.6 3.5 - 5.0 g/dL   AST 19 15 - 41 U/L   ALT 19 0 - 44 U/L   Alkaline Phosphatase 53 38 - 126 U/L   Total Bilirubin 1.1 0.3 - 1.2 mg/dL   GFR, Estimated >60 >60 mL/min   Anion gap 12 5 - 15  CBC     Status: Abnormal   Collection Time: 10/05/20 11:51 AM  Result Value Ref Range   WBC 14.2 (H) 4.0 - 10.5 K/uL    RBC 4.83 3.87 - 5.11 MIL/uL   Hemoglobin 15.6 (H) 12.0 - 15.0 g/dL   HCT 42.0 36.0 - 46.0 %   MCV 87.0 80.0 - 100.0 fL   MCH 32.3 26.0 - 34.0 pg   MCHC 37.1 (H) 30.0 - 36.0 g/dL   RDW 11.9 11.5 - 15.5 %   Platelets 287 150 - 400 K/uL   nRBC 0.0 0.0 - 0.2 %  Urinalysis, Complete w Microscopic     Status: Abnormal   Collection Time: 10/05/20 11:51 AM  Result Value Ref Range   Color, Urine YELLOW YELLOW   APPearance CLEAR CLEAR   Specific Gravity, Urine >1.030 (  H) 1.005 - 1.030   pH 5.5 5.0 - 8.0   Glucose, UA NEGATIVE NEGATIVE mg/dL   Hgb urine dipstick LARGE (A) NEGATIVE   Bilirubin Urine SMALL (A) NEGATIVE   Ketones, ur 40 (A) NEGATIVE mg/dL   Protein, ur >300 (A) NEGATIVE mg/dL   Nitrite NEGATIVE NEGATIVE   Leukocytes,Ua TRACE (A) NEGATIVE   RBC / HPF 11-20 0 - 5 RBC/hpf   WBC, UA 6-10 0 - 5 WBC/hpf   Bacteria, UA MANY (A) NONE SEEN   Squamous Epithelial / LPF 11-20 0 - 5   Mucus PRESENT    Budding Yeast PRESENT    ____________________________________________ ____________________________________________  RADIOLOGY  I personally reviewed all radiographic images ordered to evaluate for the above acute complaints and reviewed radiology reports and findings.  These findings were personally discussed with the patient.  Please see medical record for radiology report.  ____________________________________________   PROCEDURES  Procedure(s) performed:  Procedures    Critical Care performed: no ____________________________________________   INITIAL IMPRESSION / ASSESSMENT AND PLAN / ED COURSE  Pertinent labs & imaging results that were available during my care of the patient were reviewed by me and considered in my medical decision making (see chart for details).   DDX: Pyelo, enteritis, colitis, diverticulitis, obstruction, dehydration  Erin Briggs is a 55 y.o. who presents to the ED with presentation as described above.  Patient nontoxic-appearing borderline  low potassium.  Given nausea vomiting discomfort as described above will order CT imaging to evaluate.   The patient will be placed on continuous pulse oximetry and telemetry for monitoring.  Laboratory evaluation will be sent to evaluate for the above complaints.    Clinical Course as of 10/05/20 1649  Thu Oct 05, 2020  1605 Patient reassessed.  She is feeling improved.  States that she is ready to go home.  CT imaging is reassuring.  Presentation most consistent with pyelonephritis.  Is able to tolerate p.o. and feeling improved think that a trial of outpatient management is reasonable. [PR]    Clinical Course User Index [PR] Merlyn Lot, MD    The patient was evaluated in Emergency Department today for the symptoms described in the history of present illness. He/she was evaluated in the context of the global COVID-19 pandemic, which necessitated consideration that the patient might be at risk for infection with the SARS-CoV-2 virus that causes COVID-19. Institutional protocols and algorithms that pertain to the evaluation of patients at risk for COVID-19 are in a state of rapid change based on information released by regulatory bodies including the CDC and federal and state organizations. These policies and algorithms were followed during the patient's care in the ED.  As part of my medical decision making, I reviewed the following data within the [place] notes reviewed and incorporated, Labs reviewed, notes from prior ED visits and [place] Controlled Substance Database   ____________________________________________   FINAL CLINICAL IMPRESSION(S) / ED DIAGNOSES  Final diagnoses:  Non-intractable vomiting with nausea, unspecified vomiting type  Pyelonephritis      NEW MEDICATIONS STARTED DURING THIS VISIT:  New Prescriptions   CEPHALEXIN (KEFLEX) 500 MG CAPSULE    Take 1 capsule (500 mg total) by mouth 3 (three) times daily for 7 days.   METOCLOPRAMIDE  (REGLAN) 10 MG TABLET    Take 1 tablet (10 mg total) by mouth every 6 (six) hours as needed for nausea.     Note:  This document was prepared using Set designer  software and may include unintentional dictation errors.    Merlyn Lot, MD 10/05/20 [phone]

## 2020-10-05 NOTE — ED Notes (Signed)
See triage note  presents with some n/v/d  states sx's started about 3-4 days ago  denies any fever   last time vomiting was last pm

## 2020-10-05 NOTE — ED Notes (Signed)
No answer when called several times from lobby; no answer when phone # listed in chart called 

## 2020-10-05 NOTE — ED Notes (Signed)
Pt given ginger ale.

## 2020-10-05 NOTE — ED Triage Notes (Addendum)
Pt to ER via [facility] with complaints of nausea and vomiting x4 days. Reports her last episode of emesis was around 5am this morning. Has been taking zofran but believes she could be allergic to it because it has not been working. Had one day of diarrhea but this has stopped. Also endorses generalized abdominal pain. Denies urinary symptoms.   Reports being seen for the same yesterday but having to leave.

## 2021-02-20 ENCOUNTER — Emergency Department
Admission: EM | Admit: 2021-02-20 | Discharge: 2021-02-20 | Disposition: A | Discharge status: Home / Self Care | Payer: Self-pay | Source: Home / Self Care

## 2022-06-20 ENCOUNTER — Telehealth: Payer: Self-pay

## 2022-06-20 NOTE — Telephone Encounter (Signed)
Pt left message for call back to schedule colonoscopy

## 2022-06-21 NOTE — Telephone Encounter (Signed)
Message left for patient to return my call.  

## 2022-06-25 NOTE — Telephone Encounter (Signed)
Spoken to patient and she will try to call back this afternoon.

## 2022-06-26 NOTE — Telephone Encounter (Signed)
Message left for patient to return my call.  

## 2022-06-26 NOTE — Telephone Encounter (Signed)
Pt returned call to schedule colonoscopy please return call 

## 2022-06-27 ENCOUNTER — Telehealth: Payer: Self-pay | Admitting: *Deleted

## 2022-06-27 ENCOUNTER — Other Ambulatory Visit: Payer: Self-pay | Admitting: *Deleted

## 2022-06-27 DIAGNOSIS — Z1211 Encounter for screening for malignant neoplasm of colon: Secondary | ICD-10-CM

## 2022-06-27 MED ORDER — PEG 3350-KCL-NABCB-NACL-NASULF 236 G PO SOLR: 4000.0000 mL | Freq: Once | ORAL | 0 refills | Status: AC

## 2022-06-27 NOTE — Telephone Encounter (Signed)
Gastroenterology Pre-Procedure Review  Request Date: 07/17/2022 Requesting Physician: Dr. Servando Snare  PATIENT REVIEW QUESTIONS: The patient responded to the following health history questions as indicated:    1. Are you having any GI issues? no 2. Do you have a personal history of Polyps? no 3. Do you have a family history of Colon Cancer or Polyps? no 4. Diabetes Mellitus? no 5. Joint replacements in the past 12 months?no 6. Major health problems in the past 3 months?no 7. Any artificial heart valves, MVP, or defibrillator?no    MEDICATIONS & ALLERGIES:    Patient reports the following regarding taking any anticoagulation/antiplatelet therapy:   Plavix, Coumadin, Eliquis, Xarelto, Lovenox, Pradaxa, Brilinta, or Effient? no Aspirin? no  Patient confirms/reports the following medications:  Current Outpatient Medications  Medication Sig Dispense Refill   acetaminophen (TYLENOL) 325 MG tablet Take 2 tablets (650 mg total) every 6 (six) hours by mouth.     cholecalciferol (VITAMIN D) 1000 units tablet Take 1,000 Units by mouth daily.     citalopram (CELEXA) 20 MG tablet Take 20 mg daily by mouth.     clonazePAM (KLONOPIN) 0.5 MG tablet Take 0.5 mg 2 (two) times daily as needed by mouth for anxiety.     ibuprofen (ADVIL) 200 MG tablet Take 3 tablets (600 mg total) every 6 (six) hours by mouth.     metoCLOPramide (REGLAN) 10 MG tablet Take 1 tablet (10 mg total) by mouth every 6 (six) hours as needed for nausea. 12 tablet 0   ondansetron (ZOFRAN ODT) 4 MG disintegrating tablet Take 1 tablet (4 mg total) by mouth every 8 (eight) hours as needed for nausea or vomiting. 20 tablet 0   oxyCODONE (ROXICODONE) 5 MG immediate release tablet Take 1 tablet (5 mg total) every 6 (six) hours as needed by mouth for severe pain. 28 tablet 0   SUMAtriptan (IMITREX) 25 MG tablet Take 1 tablet by mouth daily as needed.     tranexamic acid (LYSTEDA) 650 MG TABS tablet Take 1,300 mg by mouth every 6 (six) hours as  needed.     vitamin C (ASCORBIC ACID) 500 MG tablet Take 1,000 mg daily by mouth.     Vitamin D, Ergocalciferol, (DRISDOL) 50000 units CAPS capsule Take 50,000 Units every Friday by mouth. In the morning.     No current facility-administered medications for this visit.    Patient confirms/reports the following allergies:  Allergies  Allergen Reactions   Aspirin Other (See Comments)    Acid reflux/gi upset.    No orders of the defined types were placed in this encounter.   AUTHORIZATION INFORMATION Primary Insurance: 1D#: Group #:  Secondary Insurance: 1D#: Group #:  SCHEDULE INFORMATION: Date: 07/17/2022 Time: Location:  [facility]

## 2022-06-27 NOTE — Telephone Encounter (Signed)
Colonoscopy schedule on 07/17/2022 with Dr Servando Snare at [facility]

## 2022-07-17 ENCOUNTER — Ambulatory Visit
Admission: RE | Admit: 2022-07-17 | Discharge: 2022-07-17 | Disposition: A | Discharge status: Home / Self Care | Payer: 59 | Attending: Gastroenterology | Admitting: Gastroenterology

## 2022-07-17 ENCOUNTER — Ambulatory Visit: Payer: 59 | Admitting: Anesthesiology

## 2022-07-17 ENCOUNTER — Encounter: Payer: Self-pay | Admitting: Gastroenterology

## 2022-07-17 ENCOUNTER — Encounter
Admission: RE | Disposition: A | Discharge status: Home / Self Care | Payer: Self-pay | Source: Home / Self Care | Attending: Gastroenterology

## 2022-07-17 DIAGNOSIS — K219 Gastro-esophageal reflux disease without esophagitis: Secondary | ICD-10-CM | POA: Diagnosis not present

## 2022-07-17 DIAGNOSIS — F1290 Cannabis use, unspecified, uncomplicated: Secondary | ICD-10-CM | POA: Insufficient documentation

## 2022-07-17 DIAGNOSIS — K5730 Diverticulosis of large intestine without perforation or abscess without bleeding: Secondary | ICD-10-CM | POA: Insufficient documentation

## 2022-07-17 DIAGNOSIS — Z79899 Other long term (current) drug therapy: Secondary | ICD-10-CM | POA: Insufficient documentation

## 2022-07-17 DIAGNOSIS — Z1211 Encounter for screening for malignant neoplasm of colon: Secondary | ICD-10-CM | POA: Insufficient documentation

## 2022-07-17 DIAGNOSIS — K640 First degree hemorrhoids: Secondary | ICD-10-CM | POA: Insufficient documentation

## 2022-07-17 DIAGNOSIS — F419 Anxiety disorder, unspecified: Secondary | ICD-10-CM | POA: Diagnosis not present

## 2022-07-17 DIAGNOSIS — Z85828 Personal history of other malignant neoplasm of skin: Secondary | ICD-10-CM | POA: Diagnosis not present

## 2022-07-17 DIAGNOSIS — J449 Chronic obstructive pulmonary disease, unspecified: Secondary | ICD-10-CM | POA: Insufficient documentation

## 2022-07-17 DIAGNOSIS — F32A Depression, unspecified: Secondary | ICD-10-CM | POA: Insufficient documentation

## 2022-07-17 HISTORY — DX: Gastro-esophageal reflux disease without esophagitis: K21.9

## 2022-07-17 HISTORY — PX: POLYPECTOMY: SHX5525

## 2022-07-17 HISTORY — DX: Chronic obstructive pulmonary disease, unspecified: J44.9

## 2022-07-17 HISTORY — PX: COLONOSCOPY WITH PROPOFOL: SHX5780

## 2022-07-17 SURGERY — COLONOSCOPY WITH PROPOFOL
Anesthesia: General

## 2022-07-17 MED ORDER — PROPOFOL 10 MG/ML IV BOLUS
INTRAVENOUS | Status: DC | PRN
  Administered 2022-07-17 (×2): 50 mg via INTRAVENOUS
  Administered 2022-07-17: 100 mg via INTRAVENOUS
  Administered 2022-07-17: 160 mg via INTRAVENOUS
  Administered 2022-07-17 (×2): 50 mg via INTRAVENOUS
  Administered 2022-07-17: 40 mg via INTRAVENOUS

## 2022-07-17 MED ORDER — PROPOFOL 10 MG/ML IV BOLUS
INTRAVENOUS | Status: AC
  Filled 2022-07-17: qty 40

## 2022-07-17 MED ORDER — SODIUM CHLORIDE 0.9 % IV SOLN: INTRAVENOUS | Status: DC

## 2022-07-17 MED ORDER — LIDOCAINE HCL (CARDIAC) PF 100 MG/5ML IV SOSY
PREFILLED_SYRINGE | INTRAVENOUS | Status: DC | PRN
  Administered 2022-07-17: 50 mg via INTRAVENOUS

## 2022-07-17 MED ORDER — PROPOFOL 1000 MG/100ML IV EMUL
INTRAVENOUS | Status: AC
  Filled 2022-07-17: qty 200

## 2022-07-17 MED ORDER — PROPOFOL 1000 MG/100ML IV EMUL
INTRAVENOUS | Status: AC
  Filled 2022-07-17: qty 100

## 2022-07-17 NOTE — H&P (Signed)
Erin Minium, MD [facility] [address] [place], [place] [address] Phone: [phone] Fax : [phone]  Primary Care Physician:  Cassell Clement, MD Primary Gastroenterologist:  Dr. Servando Snare  Pre-Procedure History & Physical: HPI:  Erin Briggs is a 57 y.o. female is here for a screening colonoscopy.   Past Medical History:  Diagnosis Date   Anxiety    Closed fracture of right distal radius    COPD (chronic obstructive pulmonary disease) (HCC)    Depression    GERD (gastroesophageal reflux disease)    Hepatitis B    Marijuana abuse    smokes daily   Skin cancer     Past Surgical History:  Procedure Laterality Date   CESAREAN SECTION     COLONOSCOPY WITH PROPOFOL     OPEN REDUCTION INTERNAL FIXATION (ORIF) DISTAL RADIAL FRACTURE Right 12/10/2016   Procedure: OPEN TREATMENT OF RIGHT DISTAL RADIUS AND ULNAR FRACTURES;  Surgeon: Mack Hook, MD;  Location: [facility];  Service: Orthopedics;  Laterality: Right;  GENERAL ANESTHESIA WITH PRE-OP BLOCK    Prior to Admission medications   Medication Sig Start Date End Date Taking? Authorizing Provider  acetaminophen (TYLENOL) 325 MG tablet Take 2 tablets (650 mg total) every 6 (six) hours by mouth. 12/10/16   Mack Hook, MD  cholecalciferol (VITAMIN D) 1000 units tablet Take 1,000 Units by mouth daily.    [provider]  citalopram (CELEXA) 20 MG tablet Take 20 mg daily by mouth.    [provider]  clonazePAM (KLONOPIN) 0.5 MG tablet Take 0.5 mg 2 (two) times daily as needed by mouth for anxiety.    [provider]  ibuprofen (ADVIL) 200 MG tablet Take 3 tablets (600 mg total) every 6 (six) hours by mouth. 12/10/16   Mack Hook, MD  metoCLOPramide (REGLAN) 10 MG tablet Take 1 tablet (10 mg total) by mouth every 6 (six) hours as needed for nausea. 10/05/20 10/05/21  Willy Eddy, MD  ondansetron (ZOFRAN ODT) 4 MG disintegrating tablet Take 1 tablet (4 mg total)  by mouth every 8 (eight) hours as needed for nausea or vomiting. 03/13/20   Irean Hong, MD  oxyCODONE (ROXICODONE) 5 MG immediate release tablet Take 1 tablet (5 mg total) every 6 (six) hours as needed by mouth for severe pain. 12/10/16   Mack Hook, MD  SUMAtriptan (IMITREX) 25 MG tablet Take 1 tablet by mouth daily as needed. 11/16/13   [provider]  tranexamic acid (LYSTEDA) 650 MG TABS tablet Take 1,300 mg by mouth every 6 (six) hours as needed.    [provider]  vitamin C (ASCORBIC ACID) 500 MG tablet Take 1,000 mg daily by mouth.    [provider]  Vitamin D, Ergocalciferol, (DRISDOL) 50000 units CAPS capsule Take 50,000 Units every Friday by mouth. In the morning.    [provider]    Allergies as of 06/27/2022 - Review Complete 10/05/2020  Allergen Reaction Noted   Aspirin Other (See Comments) 12/05/2016    No family history on file.  Social History   Socioeconomic History   Marital status: Married    Spouse name: Not on file   Number of children: Not on file   Years of education: Not on file   Highest education level: Not on file  Occupational History   Not on file  Tobacco Use   Smoking status: Never   Smokeless tobacco: Never  Substance and Sexual Activity   Alcohol use: Yes  Comment: occasionally   Drug use: Yes    Types: Marijuana    Comment: smokes daily (last time "x1 week ago")   Sexual activity: Not on file  Other Topics Concern   Not on file  Social History Narrative   Not on file   Social Determinants of Health   Financial Resource Strain: Not on file  Food Insecurity: Not on file  Transportation Needs: Not on file  Physical Activity: Not on file  Stress: Not on file  Social Connections: Not on file  Intimate Partner Violence: Not on file    Review of Systems: See HPI, otherwise negative ROS  Physical Exam: There were no vitals taken for this visit. General:   Alert,  pleasant and cooperative  in NAD Head:  Normocephalic and atraumatic. Neck:  Supple; no masses or thyromegaly. Lungs:  Clear throughout to auscultation.    Heart:  Regular rate and rhythm. Abdomen:  Soft, nontender and nondistended. Normal bowel sounds, without guarding, and without rebound.   Neurologic:  Alert and  oriented x4;  grossly normal neurologically.  Impression/Plan: Erin Briggs is now here to undergo a screening colonoscopy.  Risks, benefits, and alternatives regarding colonoscopy have been reviewed with the patient.  Questions have been answered.  All parties agreeable.

## 2022-07-17 NOTE — Anesthesia Preprocedure Evaluation (Addendum)
Anesthesia Evaluation  Patient identified by MRN, date of birth, ID band Patient awake    Reviewed: Allergy & Precautions, NPO status , Patient's Chart, lab work & pertinent test results  History of Anesthesia Complications Negative for: history of anesthetic complications  Airway Mallampati: III  TM Distance: >3 FB Neck ROM: full    Dental no notable dental hx.    Pulmonary COPD   Pulmonary exam normal        Cardiovascular negative cardio ROS Normal cardiovascular exam     Neuro/Psych  PSYCHIATRIC DISORDERS Anxiety Depression    negative neurological ROS     GI/Hepatic ,GERD  Controlled,,(+)     substance abuse  marijuana use, Hepatitis -, B  Endo/Other  negative endocrine ROS    Renal/GU negative Renal ROS  negative genitourinary   Musculoskeletal   Abdominal   Peds  Hematology negative hematology ROS (+)   Anesthesia Other Findings Past Medical History: No date: Anxiety No date: Closed fracture of right distal radius No date: COPD (chronic obstructive pulmonary disease) (HCC) No date: Depression No date: GERD (gastroesophageal reflux disease) No date: Hepatitis B No date: Marijuana abuse     Comment:  smokes daily No date: Skin cancer  Past Surgical History: No date: CESAREAN SECTION No date: COLONOSCOPY WITH PROPOFOL 12/10/2016: OPEN REDUCTION INTERNAL FIXATION (ORIF) DISTAL RADIAL  FRACTURE; Right     Comment:  Procedure: OPEN TREATMENT OF RIGHT DISTAL RADIUS AND               ULNAR FRACTURES;  Surgeon: Mack Hook, MD;                Location: [facility];  Service:               Orthopedics;  Laterality: Right;  GENERAL ANESTHESIA WITH              PRE-OP BLOCK  BMI    Body Mass Index: 31.28 kg/m      Reproductive/Obstetrics negative OB ROS                             Anesthesia Physical Anesthesia Plan  ASA: 3  Anesthesia Plan: General    Post-op Pain Management: Minimal or no pain anticipated   Induction: Intravenous  PONV Risk Score and Plan: 2 and Propofol infusion and TIVA  Airway Management Planned: Natural Airway and Nasal Cannula  Additional Equipment:   Intra-op Plan:   Post-operative Plan:   Informed Consent: I have reviewed the patients History and Physical, chart, labs and discussed the procedure including the risks, benefits and alternatives for the proposed anesthesia with the patient or authorized representative who has indicated his/her understanding and acceptance.     Dental Advisory Given  Plan Discussed with: Anesthesiologist, CRNA and Surgeon  Anesthesia Plan Comments: (Patient consented for risks of anesthesia including but not limited to:  - adverse reactions to medications - risk of airway placement if required - damage to eyes, teeth, lips or other oral mucosa - nerve damage due to positioning  - sore throat or hoarseness - Damage to heart, brain, nerves, lungs, other parts of body or loss of life  Patient voiced understanding.)       Anesthesia Quick Evaluation

## 2022-07-17 NOTE — Anesthesia Postprocedure Evaluation (Signed)
Anesthesia Post Note  Patient: Erin Briggs  Procedure(s) Performed: COLONOSCOPY WITH PROPOFOL  Patient location during evaluation: Endoscopy Anesthesia Type: General Level of consciousness: awake and alert Pain management: pain level controlled Vital Signs Assessment: post-procedure vital signs reviewed and stable Respiratory status: spontaneous breathing, nonlabored ventilation, respiratory function stable and patient connected to nasal cannula oxygen Cardiovascular status: blood pressure returned to baseline and stable Postop Assessment: no apparent nausea or vomiting Anesthetic complications: no   No notable events documented.   Last Vitals:  Vitals:   07/17/22 0810 07/17/22 0826  BP: (!) 168/84   Pulse:  60  Resp:    Temp: (!) 36.1 C   SpO2:      Last Pain:  Vitals:   07/17/22 0826  TempSrc:   PainSc: 0-No pain                 Louie Boston

## 2022-07-17 NOTE — Transfer of Care (Signed)
Immediate Anesthesia Transfer of Care Note  Patient: Erin Briggs  Procedure(s) Performed: COLONOSCOPY WITH PROPOFOL  Patient Location: Endoscopy Unit  Anesthesia Type:General  Level of Consciousness: drowsy  Airway & Oxygen Therapy: Patient Spontanous Breathing and Patient connected to nasal cannula oxygen  Post-op Assessment: Report given to RN, Post -op Vital signs reviewed and stable, and Patient moving all extremities  Post vital signs: Reviewed and stable  Last Vitals:  Vitals Value Taken Time  BP 123/55 07/17/22 0758  Temp    Pulse 64 07/17/22 0758  Resp 27 07/17/22 0758  SpO2 98 % 07/17/22 0758  Vitals shown include unvalidated device data.  Last Pain:  Vitals:   07/17/22 0757  TempSrc:   PainSc: 0-No pain         Complications: No notable events documented.

## 2022-07-17 NOTE — Op Note (Signed)
[facility] Patient Name: Erin Briggs Procedure Date: 07/17/2022 7:28 AM MRN: [id number] Account #: [id number] Date of Birth: [birth year] Admit Type: Outpatient Age: 57 Room: [facility] ENDO ROOM 3 Gender: Female Note Status: Finalized Instrument Name: Prentice Docker [id number] Procedure:             Colonoscopy Indications:           Screening for colorectal malignant neoplasm Providers:             Midge Minium MD, MD Medicines:             Propofol per Anesthesia Complications:         No immediate complications. Procedure:             Pre-Anesthesia Assessment:                        - Prior to the procedure, a History and Physical was                         performed, and patient medications and allergies were                         reviewed. The patient's tolerance of previous                         anesthesia was also reviewed. The risks and benefits                         of the procedure and the sedation options and risks                         were discussed with the patient. All questions were                         answered, and informed consent was obtained. Prior                         Anticoagulants: The patient has taken no anticoagulant                         or antiplatelet agents. ASA Grade Assessment: II - A                         patient with mild systemic disease. After reviewing                         the risks and benefits, the patient was deemed in                         satisfactory condition to undergo the procedure.                        After obtaining informed consent, the colonoscope was                         passed under direct vision. Throughout the procedure,                         the patient's blood  pressure, pulse, and oxygen                         saturations were monitored continuously. The                         Colonoscope was introduced through the anus and                         advanced to the the  cecum, identified by appendiceal                         orifice and ileocecal valve. The colonoscopy was                         performed without difficulty. The patient tolerated                         the procedure well. The quality of the bowel                         preparation was adequate to identify polyps. Findings:      The perianal and digital rectal examinations were normal.      Three sessile polyps were found in the sigmoid colon. The polyps were 3       to 5 mm in size. These polyps were removed with a cold snare. Resection       and retrieval were complete.      Multiple small-mouthed diverticula were found in the entire colon.      Non-bleeding internal hemorrhoids were found during retroflexion. The       hemorrhoids were Grade I (internal hemorrhoids that do not prolapse). Impression:            - Three 3 to 5 mm polyps in the sigmoid colon, removed                         with a cold snare. Resected and retrieved.                        - Diverticulosis in the entire examined colon.                        - Non-bleeding internal hemorrhoids. Recommendation:        - Discharge patient to home.                        - Resume previous diet.                        - Continue present medications.                        - If the pathology report reveals adenomatous tissue,                         then repeat the colonoscopy for surveillance in 5                         years. Procedure Code(s):     --- Professional ---  09811, Colonoscopy, flexible; with removal of                         tumor(s), polyp(s), or other lesion(s) by snare                         technique Diagnosis Code(s):     --- Professional ---                        Z12.11, Encounter for screening for malignant neoplasm                         of colon                        D12.5, Benign neoplasm of sigmoid colon CPT copyright 2022 American [employer] Association. All rights  reserved. The codes documented in this report are preliminary and upon coder review may  be revised to meet current compliance requirements. Midge Minium MD, MD 07/17/2022 7:57:04 AM This report has been signed electronically. Number of Addenda: 0 Note Initiated On: 07/17/2022 7:28 AM Scope Withdrawal Time: 0 hours 7 minutes 13 seconds  Total Procedure Duration: 0 hours 16 minutes 48 seconds  Estimated Blood Loss:  Estimated blood loss: none.      [facility]

## 2022-07-18 ENCOUNTER — Encounter: Payer: Self-pay | Admitting: Gastroenterology

## 2022-08-19 IMAGING — CR DG CHEST 2V
2 series · 2 of 2 positions shown · non-contrast
Comparison: None.

CLINICAL DATA: Cough.  Vomiting and diarrhea.

EXAM:
CHEST - 2 VIEW

[chest pa]
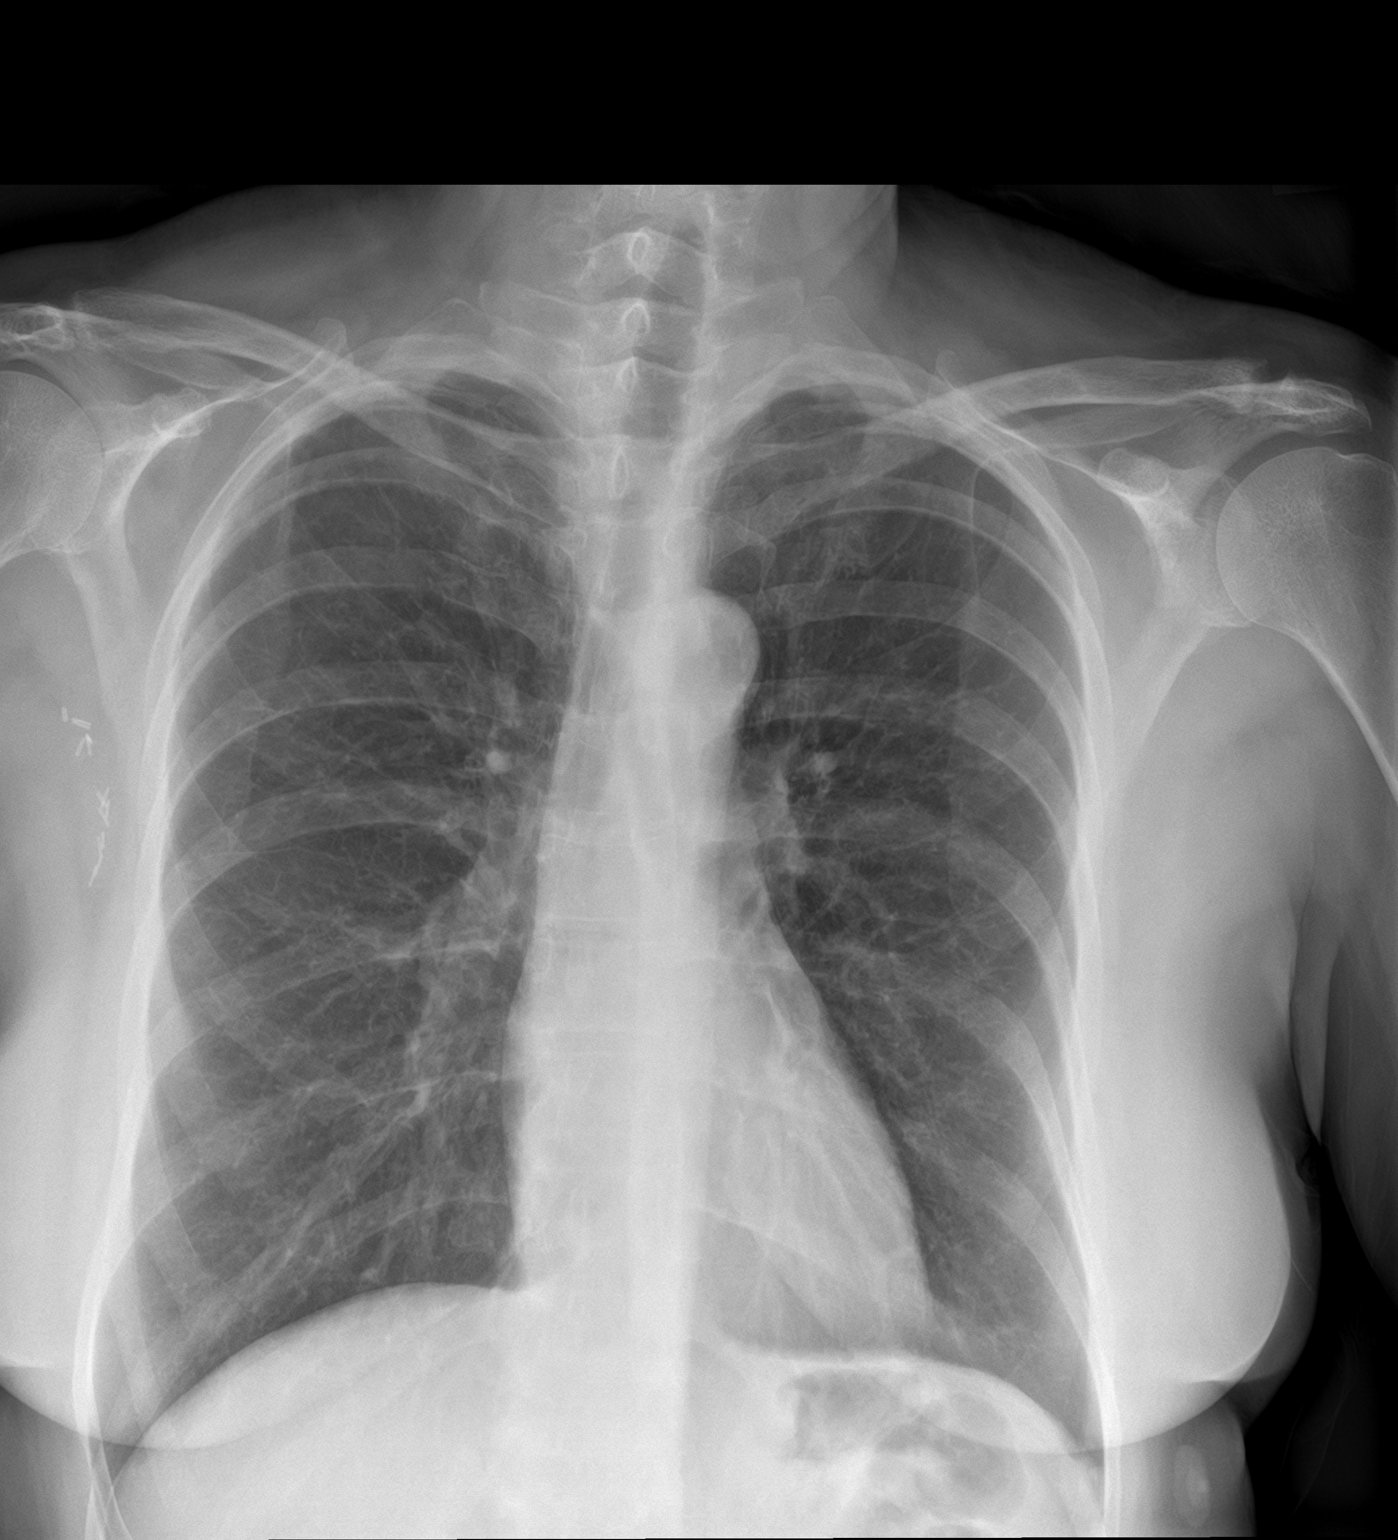

[chest lat]
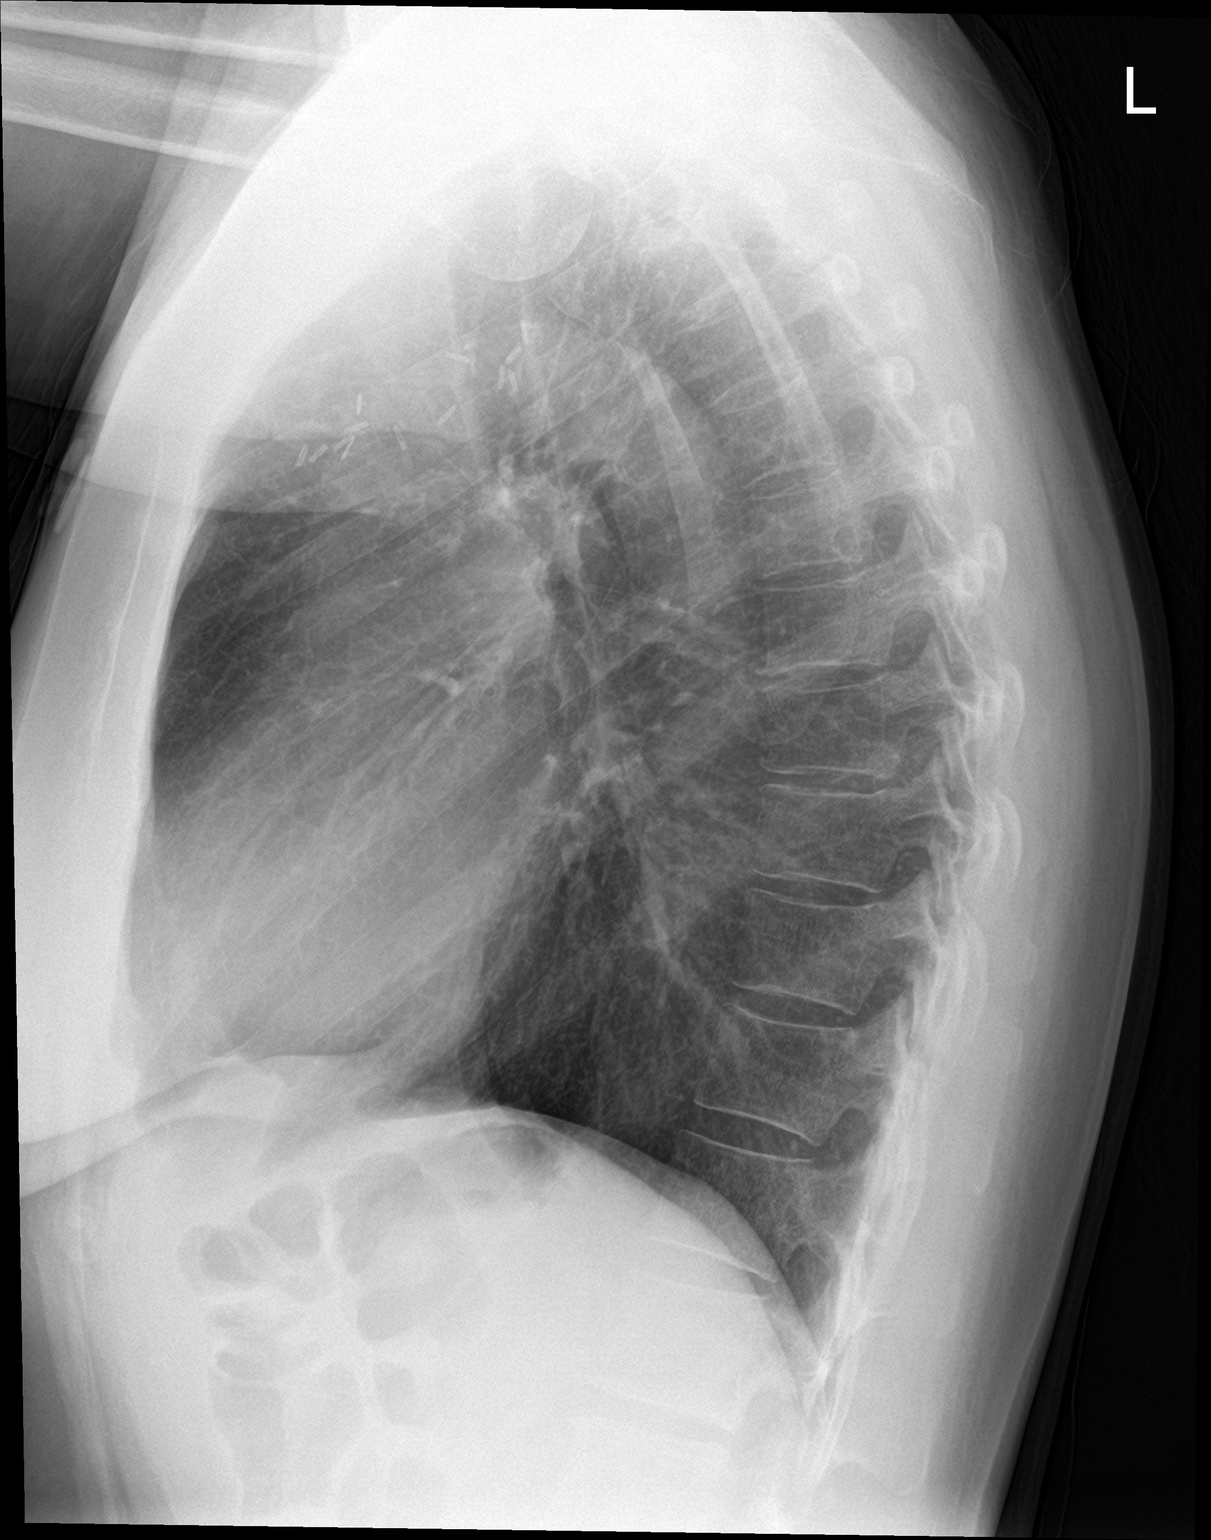

[2 of 2 positions shown; findings below may reference images not displayed]

FINDINGS: Heart size is normal. Mediastinal shadows are normal. There may be
bronchial thickening suggesting bronchitis but there is no
infiltrate, collapse or effusion. Surgical clips noted in the right
axilla. No significant bone finding.
IMPRESSION: Possible bronchitis. No consolidation or collapse.

## 2023-01-22 ENCOUNTER — Other Ambulatory Visit: Payer: Self-pay

## 2023-01-22 ENCOUNTER — Emergency Department
Admission: EM | Admit: 2023-01-22 | Discharge: 2023-01-22 | Disposition: A | Discharge status: Home / Self Care | Payer: 59 | Attending: Emergency Medicine | Admitting: Emergency Medicine

## 2023-01-22 DIAGNOSIS — B0223 Postherpetic polyneuropathy: Secondary | ICD-10-CM | POA: Insufficient documentation

## 2023-01-22 DIAGNOSIS — R21 Rash and other nonspecific skin eruption: Secondary | ICD-10-CM | POA: Diagnosis present

## 2023-01-22 NOTE — ED Triage Notes (Signed)
Pt sts that she had Shingles about a month ago however she sts that she has a bump under her right breast still.

## 2023-01-22 NOTE — ED Provider Notes (Signed)
[facility] Provider Note    Event Date/Time   First MD Initiated Contact with Patient 01/22/23 1541     (approximate)   History   Rash   HPI  Erin Briggs is a 57 y.o. female who presents today for evaluation of rash.  Patient reports that she had shingles approximately 1 month ago but continues to have skin changes in that area that she wants to have checked out.  Reports that her pain is significantly improved.  No fevers or chills.  There are no active problems to display for this patient.         Physical Exam   Triage Vital Signs: ED Triage Vitals [01/22/23 1532]  Encounter Vitals Group     BP (!) 140/69     Systolic BP Percentile      Diastolic BP Percentile      Pulse Rate 72     Resp 17     Temp 97.9 F (36.6 C)     Temp Source Oral     SpO2 98 %     Weight 225 lb (102.1 kg)     Height 5\' 10"  (1.778 m)     Head Circumference      Peak Flow      Pain Score 3     Pain Loc      Pain Education      Exclude from Growth Chart     Most recent vital signs: Vitals:   01/22/23 1532  BP: (!) 140/69  Pulse: 72  Resp: 17  Temp: 97.9 F (36.6 C)  SpO2: 98%    Physical Exam Vitals and nursing note reviewed.  Constitutional:      General: Awake and alert. No acute distress.    Appearance: Normal appearance. The patient is obese.  HENT:     Head: Normocephalic and atraumatic.     Mouth: Mucous membranes are moist.  Eyes:     General: PERRL. Normal EOMs        Right eye: No discharge.        Left eye: No discharge.     Conjunctiva/sclera: Conjunctivae normal.  Cardiovascular:     Rate and Rhythm: Normal rate and regular rhythm.     Pulses: Normal pulses.  Pulmonary:     Effort: Pulmonary effort is normal. No respiratory distress.     Breath sounds: Normal breath sounds.  Abdominal:     Abdomen is soft. There is no abdominal tenderness. No rebound or guarding. No distention. Musculoskeletal:        General: No  swelling. Normal range of motion.     Cervical back: Normal range of motion and neck supple.  Skin:    General: Skin is warm and dry.     Capillary Refill: Capillary refill takes less than 2 seconds.     Findings: Old appearing scars from what appears to be a shingles rash in what appears to be T5 dermatome.  No new lesions.  No open wounds.  No vesicles or bullae.  No erythema or swelling. Neurological:     Mental Status: The patient is awake and alert.      ED Results / Procedures / Treatments   Labs (all labs ordered are listed, but only abnormal results are displayed) Labs Reviewed - No data to display   EKG     RADIOLOGY     PROCEDURES:  Critical Care performed:   Procedures   MEDICATIONS ORDERED IN ED:  Medications - No data to display   IMPRESSION / MDM / ASSESSMENT AND PLAN / ED COURSE  I reviewed the triage vital signs and the nursing notes.   Differential diagnosis includes, but is not limited to, postherpetic neuralgia, shingles pain, shingles scar.  Patient is awake and alert, hemodynamically stable and afebrile.  She has what appears to be scarring from her recent shingles infection to her T5 dermatome which is where she had shingles before.  There is no new lesions.  There are no open wounds.  There are no vesicles or bullae.  No erythema or swelling.  There is no evidence of superimposed infection or abscess.  There is no new shingles rash.  This appears to be the normal progression of her healing and patient is reassured by this.  She feels that her pain is controlled.  We discussed return precautions and outpatient follow-up.  Patient understands and agrees with plan.  She was discharged in stable condition.   Patient's presentation is most consistent with acute, uncomplicated illness.    FINAL CLINICAL IMPRESSION(S) / ED DIAGNOSES   Final diagnoses:  Shingles (herpes zoster) polyneuropathy     Rx / DC Orders   ED Discharge Orders      None        Note:  This document was prepared using Dragon voice recognition software and may include unintentional dictation errors.   Jackelyn Hoehn, PA-C 01/22/23 1749    Trinna Post, MD 01/22/23 [phone]

## 2023-01-22 NOTE — ED Notes (Signed)
See triage notes. Patient c/o a large area of swelling and bumps under her right breast. Patient had shingles almost a month ago.

## 2023-01-22 NOTE — Discharge Instructions (Signed)
You may continue to use the pain medicine that was prescribed to you previously, as well as Tylenol/ibuprofen, and your Lidoderm patches.  Please follow-up with your outpatient provider.  Please return for any new, worsening, or change in symptoms or other concerns.  It was a pleasure caring for you today.

## 2024-03-17 ENCOUNTER — Ambulatory Visit: Admitting: Nurse Practitioner

## 2024-06-17 ENCOUNTER — Ambulatory Visit: Admitting: Nurse Practitioner
